# Patient Record
Sex: Female | Born: 1975 | Race: White | Hispanic: No | Smoking: Never smoker
Health system: Southern US, Community
[De-identification: ages and names within clinical notes are randomized; demographics above are authoritative.]

## PROBLEM LIST (undated history)

## (undated) DIAGNOSIS — G610 Guillain-Barre syndrome: Secondary | ICD-10-CM

## (undated) HISTORY — PX: APPENDECTOMY: SHX54

## (undated) HISTORY — PX: TONSILLECTOMY: SUR1361

---

## 2016-07-08 ENCOUNTER — Encounter (HOSPITAL_COMMUNITY): Payer: Self-pay | Admitting: *Deleted

## 2016-07-08 ENCOUNTER — Emergency Department (HOSPITAL_COMMUNITY): Payer: Self-pay

## 2016-07-08 ENCOUNTER — Emergency Department (HOSPITAL_COMMUNITY)
Admission: EM | Admit: 2016-07-08 | Discharge: 2016-07-08 | Disposition: A | Discharge status: Home / Self Care | Payer: Self-pay | Attending: Emergency Medicine | Admitting: Emergency Medicine

## 2016-07-08 DIAGNOSIS — N200 Calculus of kidney: Secondary | ICD-10-CM | POA: Insufficient documentation

## 2016-07-08 DIAGNOSIS — F17200 Nicotine dependence, unspecified, uncomplicated: Secondary | ICD-10-CM | POA: Insufficient documentation

## 2016-07-08 DIAGNOSIS — R109 Unspecified abdominal pain: Secondary | ICD-10-CM

## 2016-07-08 LAB — URINALYSIS, ROUTINE W REFLEX MICROSCOPIC
BILIRUBIN URINE: NEGATIVE
GLUCOSE, UA: NEGATIVE mg/dL
KETONES UR: NEGATIVE mg/dL
Nitrite: NEGATIVE
Protein, ur: NEGATIVE mg/dL
SPECIFIC GRAVITY, URINE: 1.004 — AB (ref 1.005–1.030)
pH: 6 (ref 5.0–8.0)

## 2016-07-08 LAB — POC URINE PREG, ED: Preg Test, Ur: NEGATIVE

## 2016-07-08 MED ORDER — IBUPROFEN 400 MG PO TABS: 400.0000 mg | ORAL_TABLET | Freq: Three times a day (TID) | ORAL | 0 refills | Status: AC

## 2016-07-08 MED ORDER — KETOROLAC TROMETHAMINE 30 MG/ML IJ SOLN
15.0000 mg | Freq: Once | INTRAMUSCULAR | Status: AC
  Administered 2016-07-08: 15 mg via INTRAMUSCULAR
  Filled 2016-07-08: qty 1

## 2016-07-08 NOTE — ED Triage Notes (Signed)
Pt is here for left side pain (under the axilla) which has been intermittent for about a week.  No pain or burning with urination.  Pt states that at times she did have some flank pain.  No GU or GI symtoms with this, no fever

## 2016-07-08 NOTE — Discharge Instructions (Signed)
As discussed, tonight's evaluation has been largely reassuring, and there is suspicion for your pain coming from a kidney stone. It is important to take all medication as directed, monitored carefully, and do not hesitate to return here for concerning changes. Otherwise, please be sure to follow-up with your primary care physician and our urology colleagues.

## 2016-07-08 NOTE — ED Provider Notes (Signed)
[facility]-EMERGENCY DEPT Provider Note   CSN: [id number] Arrival date & time: 07/08/16  1737     History   Chief Complaint Chief Complaint  Patient presents with  . Flank Pain    left mid axillary line    HPI Morgan Lester is a 41 y.o. female.     HPI    Presents with flank pain. Pain began a few days ago, suddenly. Since onset pain has been consistent, occurring, and stopping without clear intervention or precipitant. Pain is focally in the left upper quadrant, left mid thoracic back. Patient is sore, crampy. No medication taken for pain relief There is no associated nausea, vomiting, diarrhea, fever, chills, dysuria. Patient does have history of appendectomy, but otherwise no abdominal surgery, no substantial medical problems, including no history of kidney disease.  History reviewed. No pertinent past medical history.  There are no active problems to display for this patient.      Past Surgical History:  Procedure Laterality Date  . APPENDECTOMY    . TONSILLECTOMY      OB History    No data available       Home Medications    Prior to Admission medications   Not on File    Family History No family history on file.  Social History Social History  Substance Use Topics  . Smoking status: Current Every Day Smoker  . Smokeless tobacco: Never Used  . Alcohol use No     Allergies   Influenza vaccines and Tape   Review of Systems Review of Systems  Constitutional:       Per HPI, otherwise negative  HENT:       Per HPI, otherwise negative  Respiratory:       Per HPI, otherwise negative  Cardiovascular:       Per HPI, otherwise negative  Gastrointestinal: Negative for vomiting.  Endocrine:       Negative aside from HPI  Genitourinary:       Neg aside from HPI   Musculoskeletal:       Per HPI, otherwise negative  Skin: Negative.   Neurological: Negative for syncope.     Physical Exam Updated Vital Signs BP 119/72 (BP Location: Left  Arm)   Pulse 85   Temp 98.4 F (36.9 C) (Oral)   Resp 16   Wt 233 lb (105.7 kg)   LMP 07/01/2016   SpO2 100%   Physical Exam  Constitutional: She is oriented to person, place, and time. She appears well-developed and well-nourished. No distress.  HENT:  Head: Normocephalic and atraumatic.  Eyes: Conjunctivae and EOM are normal.  Cardiovascular: Normal rate and regular rhythm.   Pulmonary/Chest: Effort normal and breath sounds normal. No stridor. No respiratory distress.  Abdominal: She exhibits no distension.  Tenderness throughout the left costovertebral angle area, left upper quadrant, no other abdominal pain  Musculoskeletal: She exhibits no edema.  Neurological: She is alert and oriented to person, place, and time. No cranial nerve deficit.  Skin: Skin is warm and dry.  Psychiatric: She has a normal mood and affect.  Nursing note and vitals reviewed.    ED Treatments / Results  Labs (all labs ordered are listed, but only abnormal results are displayed) Labs Reviewed  URINALYSIS, ROUTINE W REFLEX MICROSCOPIC - Abnormal; Notable for the following:       Result Value   Color, Urine STRAW (*)    Specific Gravity, Urine 1.004 (*)    Hgb urine dipstick SMALL (*)  Leukocytes, UA SMALL (*)    Bacteria, UA RARE (*)    Squamous Epithelial / LPF 0-5 (*)    All other components within normal limits  POC URINE PREG, ED    Radiology Ct Renal Stone Study  Result Date: 07/08/2016 CLINICAL DATA:  Left flank pain. EXAM: CT ABDOMEN AND PELVIS WITHOUT CONTRAST TECHNIQUE: Multidetector CT imaging of the abdomen and pelvis was performed following the standard protocol without IV contrast. COMPARISON:  None. FINDINGS: Lower chest: No significant pulmonary nodules or acute consolidative airspace disease. Hepatobiliary: Normal liver size. Subcentimeter hypodense posterior right liver dome lesion, too small to characterize, for which no further follow-up is required unless the patient has  risk factors for liver malignancy. Otherwise no liver lesions. Normal gallbladder with no radiopaque cholelithiasis. No biliary ductal dilatation. Pancreas: Normal, with no mass or duct dilation. Spleen: Normal size. No mass. Adrenals/Urinary Tract: Normal adrenals. No contour deforming renal mass. No hydronephrosis. Punctate nonobstructing 1-2 mm lower right renal stone. No additional right renal stones. No left renal stones. Normal caliber ureters, with no ureteral stones . Normal bladder. Stomach/Bowel: Grossly normal stomach. Normal caliber small bowel with no small bowel wall thickening. Appendectomy. Normal large bowel with no diverticulosis, large bowel wall thickening or pericolonic fat stranding. Vascular/Lymphatic: Normal caliber abdominal aorta. No pathologically enlarged lymph nodes in the abdomen or pelvis. Reproductive: Grossly normal uterus.  No adnexal mass. Other: No pneumoperitoneum, ascites or focal fluid collection. Musculoskeletal: No aggressive appearing focal osseous lesions. Mild lower thoracic spondylosis. IMPRESSION: 1. Punctate nonobstructing lower right renal stone. No additional renal stones. No ureteral or bladder stones. No hydronephrosis. 2. No acute abnormality. No evidence of bowel obstruction or acute bowel inflammation. Electronically Signed   By: Delbert PhenixJason A Poff M.D.   On: 07/08/2016 19:30    Procedures Procedures (including critical care time)  Medications Ordered in ED Medications  ketorolac (TORADOL) 30 MG/ML injection 15 mg (not administered)     Initial Impression / Assessment and Plan / ED Course  I have reviewed the triage vital signs and the nursing notes.  Pertinent labs & imaging results that were available during my care of the patient were reviewed by me and considered in my medical decision making (see chart for details).  9:14 PM He appears more comfortable. We discussed all findings at length including evidence for kidney stone on the right,  suspicion for passed kidney stone on the left. Findings otherwise reassuring, with no evidence for urinary tract infection, reassuring CT scan, no hemodynamic instability. With these reassuring findings, and a soft, non-peritoneal abdomen on exam, the patient is appropriate for discharge with close outpatient follow-up.   Final Clinical Impressions(s) / ED Diagnoses   Final diagnoses:  Left flank pain  Kidney stone    New Prescriptions New Prescriptions   IBUPROFEN (ADVIL,MOTRIN) 400 MG TABLET    Take 1 tablet (400 mg total) by mouth 3 (three) times daily. Take one tablet three times daily for three days     Gerhard MunchLockwood, Kenitra Leventhal, MD 07/08/16 2115

## 2016-07-30 ENCOUNTER — Emergency Department (HOSPITAL_COMMUNITY): Payer: Self-pay

## 2016-07-30 ENCOUNTER — Emergency Department (HOSPITAL_COMMUNITY)
Admission: EM | Admit: 2016-07-30 | Discharge: 2016-07-30 | Disposition: A | Discharge status: Home / Self Care | Payer: Self-pay | Attending: Emergency Medicine | Admitting: Emergency Medicine

## 2016-07-30 ENCOUNTER — Encounter (HOSPITAL_COMMUNITY): Payer: Self-pay | Admitting: Emergency Medicine

## 2016-07-30 DIAGNOSIS — R42 Dizziness and giddiness: Secondary | ICD-10-CM | POA: Insufficient documentation

## 2016-07-30 DIAGNOSIS — F17200 Nicotine dependence, unspecified, uncomplicated: Secondary | ICD-10-CM | POA: Insufficient documentation

## 2016-07-30 DIAGNOSIS — Z79899 Other long term (current) drug therapy: Secondary | ICD-10-CM | POA: Insufficient documentation

## 2016-07-30 LAB — COMPREHENSIVE METABOLIC PANEL
ALK PHOS: 68 U/L (ref 38–126)
ALT: 16 U/L (ref 14–54)
AST: 16 U/L (ref 15–41)
Albumin: 3.9 g/dL (ref 3.5–5.0)
Anion gap: 7 (ref 5–15)
BUN: 17 mg/dL (ref 6–20)
CALCIUM: 9 mg/dL (ref 8.9–10.3)
CO2: 27 mmol/L (ref 22–32)
CREATININE: 0.62 mg/dL (ref 0.44–1.00)
Chloride: 104 mmol/L (ref 101–111)
Glucose, Bld: 94 mg/dL (ref 65–99)
Potassium: 3.9 mmol/L (ref 3.5–5.1)
Sodium: 138 mmol/L (ref 135–145)
Total Bilirubin: 0.9 mg/dL (ref 0.3–1.2)
Total Protein: 7.5 g/dL (ref 6.5–8.1)

## 2016-07-30 LAB — URINALYSIS, ROUTINE W REFLEX MICROSCOPIC
Bacteria, UA: NONE SEEN
Bilirubin Urine: NEGATIVE
GLUCOSE, UA: NEGATIVE mg/dL
KETONES UR: NEGATIVE mg/dL
Nitrite: NEGATIVE
PH: 5 (ref 5.0–8.0)
Protein, ur: NEGATIVE mg/dL
SPECIFIC GRAVITY, URINE: 1.025 (ref 1.005–1.030)

## 2016-07-30 LAB — CBC
HCT: 38.5 % (ref 36.0–46.0)
Hemoglobin: 12.5 g/dL (ref 12.0–15.0)
MCH: 30.1 pg (ref 26.0–34.0)
MCHC: 32.5 g/dL (ref 30.0–36.0)
MCV: 92.8 fL (ref 78.0–100.0)
PLATELETS: 342 10*3/uL (ref 150–400)
RBC: 4.15 MIL/uL (ref 3.87–5.11)
RDW: 12.3 % (ref 11.5–15.5)
WBC: 8.2 10*3/uL (ref 4.0–10.5)

## 2016-07-30 MED ORDER — SODIUM CHLORIDE 0.9 % IV BOLUS (SEPSIS)
1000.0000 mL | Freq: Once | INTRAVENOUS | Status: AC
  Administered 2016-07-30: 1000 mL via INTRAVENOUS

## 2016-07-30 MED ORDER — ONDANSETRON HCL 4 MG/2ML IJ SOLN
4.0000 mg | Freq: Once | INTRAMUSCULAR | Status: AC
  Administered 2016-07-30: 4 mg via INTRAVENOUS
  Filled 2016-07-30: qty 2

## 2016-07-30 MED ORDER — MECLIZINE HCL 12.5 MG PO TABS
25.0000 mg | ORAL_TABLET | Freq: Once | ORAL | Status: AC
  Administered 2016-07-30: 25 mg via ORAL
  Filled 2016-07-30: qty 2

## 2016-07-30 MED ORDER — MECLIZINE HCL 25 MG PO TABS: 25.0000 mg | ORAL_TABLET | Freq: Three times a day (TID) | ORAL | 0 refills | Status: DC | PRN

## 2016-07-30 MED ORDER — ONDANSETRON 4 MG PO TBDP: ORAL_TABLET | ORAL | 0 refills | Status: DC

## 2016-07-30 NOTE — ED Provider Notes (Signed)
[facility] Provider Note   CSN: [id number] Arrival date & time: 07/30/16  0856     History   Chief Complaint Chief Complaint  Patient presents with  . Flank Pain    HPI Morgan Lester is a 41 y.o. female.  Patient states that she had some dizziness with nausea today. She states room feeling of spinning around   The history is provided by the patient. No language interpreter was used.  Dizziness  Quality:  Head spinning Severity:  Moderate Onset quality:  Sudden Timing:  Constant Progression:  Unchanged Chronicity:  New Context: bending over   Associated symptoms: no chest pain, no diarrhea and no headaches     History reviewed. No pertinent past medical history.  There are no active problems to display for this patient.   Past Surgical History:  Procedure Laterality Date  . APPENDECTOMY    . TONSILLECTOMY      OB History    No data available       Home Medications    Prior to Admission medications   Medication Sig Start Date End Date Taking? Authorizing Provider  acetaminophen (TYLENOL) 500 MG tablet Take 500 mg by mouth every 6 (six) hours as needed for mild pain or moderate pain.   Yes [provider]  ibuprofen (ADVIL,MOTRIN) 200 MG tablet Take 200 mg by mouth every 6 (six) hours as needed.   Yes [provider]  meclizine (ANTIVERT) 25 MG tablet Take 1 tablet (25 mg total) by mouth 3 (three) times daily as needed for dizziness. 07/30/16   Bethann Berkshire, MD  ondansetron (ZOFRAN ODT) 4 MG disintegrating tablet 4mg  ODT q4 hours prn nausea/vomit 07/30/16   Bethann Berkshire, MD    Family History History reviewed. No pertinent family history.  Social History Social History  Substance Use Topics  . Smoking status: Current Every Day Smoker  . Smokeless tobacco: Never Used  . Alcohol use No     Allergies   Influenza vaccines and Tape   Review of Systems Review of Systems  Constitutional: Negative for appetite change and  fatigue.  HENT: Negative for congestion, ear discharge and sinus pressure.   Eyes: Negative for discharge.  Respiratory: Negative for cough.   Cardiovascular: Negative for chest pain.  Gastrointestinal: Negative for abdominal pain and diarrhea.  Genitourinary: Negative for frequency and hematuria.  Musculoskeletal: Negative for back pain.  Skin: Negative for rash.  Neurological: Positive for dizziness. Negative for seizures and headaches.  Psychiatric/Behavioral: Negative for hallucinations.     Physical Exam Updated Vital Signs BP 109/78   Pulse (!) 56   Temp 97.6 F (36.4 C) (Oral)   Resp 18   Ht 5\' 7"  (1.702 m)   Wt 104.3 kg (230 lb)   LMP 07/30/2016   SpO2 96%   BMI 36.02 kg/m   Physical Exam  Constitutional: She is oriented to person, place, and time. She appears well-developed.  HENT:  Head: Normocephalic.  No nystagmus  Eyes: Conjunctivae and EOM are normal. No scleral icterus.  Neck: Neck supple. No thyromegaly present.  Cardiovascular: Normal rate and regular rhythm.  Exam reveals no gallop and no friction rub.   No murmur heard. Pulmonary/Chest: No stridor. She has no wheezes. She has no rales. She exhibits no tenderness.  Abdominal: She exhibits no distension. There is no tenderness. There is no rebound.  Musculoskeletal: Normal range of motion. She exhibits no edema.  Lymphadenopathy:    She has no cervical adenopathy.  Neurological: She is  oriented to person, place, and time. She exhibits normal muscle tone. Coordination normal.  Skin: No rash noted. No erythema.  Psychiatric: She has a normal mood and affect. Her behavior is normal.     ED Treatments / Results  Labs (all labs ordered are listed, but only abnormal results are displayed) Labs Reviewed  URINALYSIS, ROUTINE W REFLEX MICROSCOPIC - Abnormal; Notable for the following:       Result Value   APPearance HAZY (*)    Hgb urine dipstick LARGE (*)    Leukocytes, UA TRACE (*)    Squamous  Epithelial / LPF 6-30 (*)    All other components within normal limits  URINE CULTURE  COMPREHENSIVE METABOLIC PANEL  CBC    EKG  EKG Interpretation None       Radiology Ct Head Wo Contrast  Result Date: 07/30/2016 CLINICAL DATA:  Dizzy with extremity tremor since last night. EXAM: CT HEAD WITHOUT CONTRAST TECHNIQUE: Contiguous axial images were obtained from the base of the skull through the vertex without intravenous contrast. COMPARISON:  None. FINDINGS: Brain: No evidence of acute infarction, hemorrhage, hydrocephalus, extra-axial collection or mass effect. Question 6 mm pineal cyst, which would be incidental. Vascular: Negative Skull: Negative Sinuses/Orbits: Negative IMPRESSION: No acute finding or explanation for symptoms. Electronically Signed   By: Marnee SpringJonathon  Watts M.D.   On: 07/30/2016 11:47    Procedures Procedures (including critical care time)  Medications Ordered in ED Medications  sodium chloride 0.9 % bolus 1,000 mL (0 mLs Intravenous Stopped 07/30/16 1100)  ondansetron (ZOFRAN) injection 4 mg (4 mg Intravenous Given 07/30/16 0947)  meclizine (ANTIVERT) tablet 25 mg (25 mg Oral Given 07/30/16 0947)     Initial Impression / Assessment and Plan / ED Course  I have reviewed the triage vital signs and the nursing notes.  Pertinent labs & imaging results that were available during my care of the patient were reviewed by me and considered in my medical decision making (see chart for details).     Patient with vertigo symptoms. She improved with Antivert and Zofran. Labs unremarkable except for blood in her urine. Patient is on her period now. She will follow-up with family practice doctor and she is given Antivert and Zofran  Final Clinical Impressions(s) / ED Diagnoses   Final diagnoses:  Vertigo    New Prescriptions New Prescriptions   MECLIZINE (ANTIVERT) 25 MG TABLET    Take 1 tablet (25 mg total) by mouth 3 (three) times daily as needed for dizziness.    ONDANSETRON (ZOFRAN ODT) 4 MG DISINTEGRATING TABLET    4mg  ODT q4 hours prn nausea/vomit     Bethann BerkshireZammit, Tarryn Bogdan, MD 07/30/16 1303

## 2016-07-30 NOTE — ED Triage Notes (Addendum)
Pt reports bilateral flank pain radiating to lower back and abd with dysuria. Pt reports seen for same x1 week ago and reports was diagnosed with kidney stones. Pt also reports dizziness started last night. Pt reports history of vertigo and ear infections. nad noted. Pt alert and oriented. Generalized fatigue noted.

## 2016-07-30 NOTE — Discharge Instructions (Signed)
Follow-up with Dr. Delton SeeNelson for recheck.

## 2016-08-01 LAB — URINE CULTURE

## 2017-03-06 ENCOUNTER — Encounter (HOSPITAL_COMMUNITY): Payer: Self-pay | Admitting: Emergency Medicine

## 2017-03-06 ENCOUNTER — Emergency Department (HOSPITAL_COMMUNITY): Payer: Self-pay

## 2017-03-06 ENCOUNTER — Emergency Department (HOSPITAL_COMMUNITY)
Admission: EM | Admit: 2017-03-06 | Discharge: 2017-03-06 | Disposition: A | Discharge status: Home / Self Care | Payer: Self-pay | Attending: Emergency Medicine | Admitting: Emergency Medicine

## 2017-03-06 ENCOUNTER — Other Ambulatory Visit: Payer: Self-pay

## 2017-03-06 DIAGNOSIS — J209 Acute bronchitis, unspecified: Secondary | ICD-10-CM | POA: Insufficient documentation

## 2017-03-06 DIAGNOSIS — R05 Cough: Secondary | ICD-10-CM | POA: Insufficient documentation

## 2017-03-06 DIAGNOSIS — Z79899 Other long term (current) drug therapy: Secondary | ICD-10-CM | POA: Insufficient documentation

## 2017-03-06 DIAGNOSIS — G610 Guillain-Barre syndrome: Secondary | ICD-10-CM | POA: Insufficient documentation

## 2017-03-06 DIAGNOSIS — M549 Dorsalgia, unspecified: Secondary | ICD-10-CM | POA: Insufficient documentation

## 2017-03-06 DIAGNOSIS — R0789 Other chest pain: Secondary | ICD-10-CM | POA: Insufficient documentation

## 2017-03-06 HISTORY — DX: Guillain-Barre syndrome: G61.0

## 2017-03-06 LAB — POC URINE PREG, ED: Preg Test, Ur: NEGATIVE

## 2017-03-06 LAB — CBC WITH DIFFERENTIAL/PLATELET
Basophils Absolute: 0 10*3/uL (ref 0.0–0.1)
Basophils Relative: 0 %
Eosinophils Absolute: 0.1 10*3/uL (ref 0.0–0.7)
Eosinophils Relative: 1 %
HEMATOCRIT: 38.7 % (ref 36.0–46.0)
HEMOGLOBIN: 12.4 g/dL (ref 12.0–15.0)
LYMPHS ABS: 3.5 10*3/uL (ref 0.7–4.0)
LYMPHS PCT: 37 %
MCH: 29 pg (ref 26.0–34.0)
MCHC: 32 g/dL (ref 30.0–36.0)
MCV: 90.6 fL (ref 78.0–100.0)
MONOS PCT: 4 %
Monocytes Absolute: 0.4 10*3/uL (ref 0.1–1.0)
NEUTROS ABS: 5.5 10*3/uL (ref 1.7–7.7)
NEUTROS PCT: 58 %
Platelets: 396 10*3/uL (ref 150–400)
RBC: 4.27 MIL/uL (ref 3.87–5.11)
RDW: 12.4 % (ref 11.5–15.5)
WBC: 9.5 10*3/uL (ref 4.0–10.5)

## 2017-03-06 LAB — BASIC METABOLIC PANEL
ANION GAP: 8 (ref 5–15)
BUN: 14 mg/dL (ref 6–20)
CHLORIDE: 104 mmol/L (ref 101–111)
CO2: 28 mmol/L (ref 22–32)
Calcium: 9.2 mg/dL (ref 8.9–10.3)
Creatinine, Ser: 0.8 mg/dL (ref 0.44–1.00)
GFR calc non Af Amer: >60 mL/min (ref >60)
Glucose, Bld: 90 mg/dL (ref 65–99)
POTASSIUM: 3.7 mmol/L (ref 3.5–5.1)
Sodium: 140 mmol/L (ref 135–145)

## 2017-03-06 LAB — D-DIMER, QUANTITATIVE: D-Dimer, Quant: 0.47 ug/mL-FEU (ref 0.00–0.50)

## 2017-03-06 LAB — TROPONIN I

## 2017-03-06 MED ORDER — HYDROCOD POLST-CPM POLST ER 10-8 MG/5ML PO SUER: 5.0000 mL | Freq: Two times a day (BID) | ORAL | 0 refills | Status: DC | PRN

## 2017-03-06 MED ORDER — PREDNISONE 50 MG PO TABS: ORAL_TABLET | ORAL | 0 refills | Status: DC

## 2017-03-06 MED ORDER — BENZONATATE 100 MG PO CAPS
200.0000 mg | ORAL_CAPSULE | Freq: Once | ORAL | Status: AC
  Administered 2017-03-06: 200 mg via ORAL
  Filled 2017-03-06: qty 2

## 2017-03-06 MED ORDER — ALBUTEROL SULFATE (2.5 MG/3ML) 0.083% IN NEBU
2.5000 mg | INHALATION_SOLUTION | Freq: Once | RESPIRATORY_TRACT | Status: AC
  Administered 2017-03-06: 2.5 mg via RESPIRATORY_TRACT
  Filled 2017-03-06: qty 3

## 2017-03-06 MED ORDER — HYDROCOD POLST-CPM POLST ER 10-8 MG/5ML PO SUER
5.0000 mL | Freq: Once | ORAL | Status: AC
  Administered 2017-03-06: 5 mL via ORAL
  Filled 2017-03-06: qty 5

## 2017-03-06 MED ORDER — BENZONATATE 100 MG PO CAPS: 200.0000 mg | ORAL_CAPSULE | Freq: Three times a day (TID) | ORAL | 0 refills | Status: DC | PRN

## 2017-03-06 MED ORDER — IPRATROPIUM-ALBUTEROL 0.5-2.5 (3) MG/3ML IN SOLN
3.0000 mL | Freq: Once | RESPIRATORY_TRACT | Status: AC
  Administered 2017-03-06: 3 mL via RESPIRATORY_TRACT
  Filled 2017-03-06: qty 3

## 2017-03-06 MED ORDER — PREDNISONE 50 MG PO TABS
60.0000 mg | ORAL_TABLET | Freq: Once | ORAL | Status: AC
  Administered 2017-03-06: 23:00:00 60 mg via ORAL
  Filled 2017-03-06: qty 1

## 2017-03-06 NOTE — Discharge Instructions (Signed)
Make sure you are drinking plenty of fluids.  Use the medicines prescribed.  If you choose to get the tussionex prescription filled, be aware that this is a narcotic medicine and you should not drive within 4 hours of taking it as it can make you sleepy. Also, cepacol cough lozenges may help as can a teaspoon of honey every few hours.

## 2017-03-06 NOTE — ED Provider Notes (Signed)
[facility] EMERGENCY DEPARTMENT Provider Note   CSN: [id number] Arrival date & time: 03/06/17  4098     History   Chief Complaint Chief Complaint  Patient presents with  . Shortness of Breath    HPI Morgan Lester is a 42 y.o. female presenting with a one-month history of upper respiratory type symptoms.  She states a month ago she had nasal congestion with clear rhinorrhea, mild sore throat and cough which has mostly cleared except for the persistent cough.  Her cough has been nonproductive but has been fairly persistent.  Over the past 2 days she has developed increased shortness of breath and now has pleuritic pain both in her anterior chest which radiates into her middle upper back and is worsened with deep inspiration and coughing.  Her cough has been nonproductive.  She denies fevers or chills.  She denies wheezing, orthopnea, hemoptysis.  She endorses a 30-hour car ride over the Christmas holiday driving to [place] and back, and has noted bilateral lower extremity soreness and several episodes of bilateral calf muscle spasm but denies any edema or ongoing pain in her legs.  She has taken several OTC cough remedies without significant improvement in her symptoms.  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   The history is provided by the patient and the spouse.    Past Medical History:  Diagnosis Date  . Guillain Barr syndrome (HCC)     There are no active problems to display for this patient.   Past Surgical History:  Procedure Laterality Date  . APPENDECTOMY    . TONSILLECTOMY      OB History    No data available       Home Medications    Prior to Admission medications   Medication Sig Start Date End Date Taking? Authorizing Provider  acetaminophen (TYLENOL) 500 MG tablet Take 500 mg by mouth every 6 (six) hours as needed for mild pain or moderate pain.    [provider]  benzonatate (TESSALON) 100 MG capsule Take 2 capsules (200 mg  total) by mouth 3 (three) times daily as needed. 03/06/17   Burgess Amor, PA-C  chlorpheniramine-HYDROcodone (TUSSIONEX PENNKINETIC ER) 10-8 MG/5ML SUER Take 5 mLs by mouth every 12 (twelve) hours as needed for cough. 03/06/17   Burgess Amor, PA-C  ibuprofen (ADVIL,MOTRIN) 200 MG tablet Take 200 mg by mouth every 6 (six) hours as needed.    [provider]  meclizine (ANTIVERT) 25 MG tablet Take 1 tablet (25 mg total) by mouth 3 (three) times daily as needed for dizziness. 07/30/16   Bethann Berkshire, MD  ondansetron (ZOFRAN ODT) 4 MG disintegrating tablet 4mg  ODT q4 hours prn nausea/vomit 07/30/16   Bethann Berkshire, MD  predniSONE (DELTASONE) 50 MG tablet Take your next dose of prednisone tomorrow evening. 03/07/17   Burgess Amor, PA-C    Family History History reviewed. No pertinent family history.  Social History Social History   Tobacco Use  . Smoking status: Never Smoker  . Smokeless tobacco: Never Used  Substance Use Topics  . Alcohol use: No  . Drug use: No     Allergies   Influenza vaccines and Tape   Review of Systems Review of Systems  Constitutional: Negative for fever.  HENT: Negative for congestion and sore throat.   Eyes: Negative.   Respiratory: Positive for cough, chest tightness, shortness of breath and wheezing.   Cardiovascular: Positive for chest pain. Negative for palpitations and leg swelling.  Gastrointestinal: Negative for abdominal pain and nausea.  Genitourinary: Negative.   Musculoskeletal: Positive for back pain. Negative for arthralgias, joint swelling and neck pain.  Skin: Negative.  Negative for rash and wound.  Neurological: Negative for dizziness, weakness, light-headedness, numbness and headaches.  Psychiatric/Behavioral: Negative.      Physical Exam Updated Vital Signs BP 127/80 (BP Location: Right Arm)   Pulse 82   Temp 98.7 F (37.1 C) (Oral)   Resp 17   LMP 02/24/2017   SpO2 98%   Physical Exam  Constitutional: She appears  well-developed and well-nourished.  HENT:  Head: Normocephalic and atraumatic.  Eyes: Conjunctivae are normal.  Neck: Normal range of motion.  Cardiovascular: Normal rate, regular rhythm, normal heart sounds and intact distal pulses.  Pulmonary/Chest: Effort normal and breath sounds normal. No stridor. She has no decreased breath sounds. She has no wheezes. She has no rhonchi. She has no rales. She exhibits no swelling.  No respiratory distress.  The frequent dry cough during exam.  Abdominal: Soft. Bowel sounds are normal. There is no tenderness.  Musculoskeletal: Normal range of motion.       Right lower leg: She exhibits no tenderness and no edema.       Left lower leg: She exhibits no tenderness and no edema.  Neurological: She is alert.  Skin: Skin is warm and dry.  Psychiatric: She has a normal mood and affect.  Nursing note and vitals reviewed.    ED Treatments / Results  Labs (all labs ordered are listed, but only abnormal results are displayed) Labs Reviewed  BASIC METABOLIC PANEL  CBC WITH DIFFERENTIAL/PLATELET  D-DIMER, QUANTITATIVE (NOT AT [facility])  TROPONIN I  POC URINE PREG, ED    EKG  EKG Interpretation  Date/Time:  Friday March 06 2017 21:11:10 EST Ventricular Rate:  79 PR Interval:    QRS Duration: 94 QT Interval:  375 QTC Calculation: 430 R Axis:   10 Text Interpretation:  Sinus rhythm Low voltage, precordial leads Borderline repolarization abnormality Baseline wander No old tracing to compare Confirmed by Samuel JesterMcManus, Kathleen [phone]([id number]) on 03/06/2017 9:59:23 PM       Radiology Dg Chest 2 View  Result Date: 03/06/2017 CLINICAL DATA:  Shortness of breath and midsternal chest pain for several days. EXAM: CHEST  2 VIEW COMPARISON:  None. FINDINGS: The heart size and mediastinal contours are within normal limits. Both lungs are clear. The visualized skeletal structures are unremarkable. IMPRESSION: No active cardiopulmonary disease. Electronically Signed   By:  Sherian ReinWei-Chen  Lin M.D.   On: 03/06/2017 20:56    Procedures Procedures (including critical care time)  Medications Ordered in ED Medications  chlorpheniramine-HYDROcodone (TUSSIONEX) 10-8 MG/5ML suspension 5 mL (5 mLs Oral Given 03/06/17 2107)  ipratropium-albuterol (DUONEB) 0.5-2.5 (3) MG/3ML nebulizer solution 3 mL (3 mLs Nebulization Given 03/06/17 2131)  albuterol (PROVENTIL) (2.5 MG/3ML) 0.083% nebulizer solution 2.5 mg (2.5 mg Nebulization Given 03/06/17 2132)  benzonatate (TESSALON) capsule 200 mg (200 mg Oral Given 03/06/17 2253)  predniSONE (DELTASONE) tablet 60 mg (60 mg Oral Given 03/06/17 2253)     Initial Impression / Assessment and Plan / ED Course  I have reviewed the triage vital signs and the nursing notes.  Pertinent labs & imaging results that were available during my care of the patient were reviewed by me and considered in my medical decision making (see chart for details).     Patient with a one-month history of persistent cough which started with classic URI symptoms but now with nonproductive  cough and pleuritic chest and back pain, imaging negative, labs negative including a negative d-dimer test, without other significant risk factors so doubt PE.  EKG is normal.  Patient does not have fever or pharyngeal findings to suggest pertussis.  She was given an L albuterol nebulizer treatment which seemed to worsen her cough.  She had no wheezing on exam nor by history so this medication was not continued.  She did get some improvement with Tussionex so this was prescribed to her.  Also started her on Tessalon and pulse dosing with prednisone for anti-inflammatory and if it.  She was encouraged increase fluid intake, OTC cough drops, discussed other home remedies including humidified air, honey.  PRN follow-up anticipated.  Final Clinical Impressions(s) / ED Diagnoses   Final diagnoses:  Acute bronchitis, unspecified organism    ED Discharge Orders        Ordered     predniSONE (DELTASONE) 50 MG tablet     03/06/17 2242    benzonatate (TESSALON) 100 MG capsule  3 times daily PRN     03/06/17 2242    chlorpheniramine-HYDROcodone (TUSSIONEX PENNKINETIC ER) 10-8 MG/5ML SUER  Every 12 hours PRN     03/06/17 2242       Burgess Amor, PA-C 03/07/17 0328    Samuel Jester, DO 03/08/17 1535

## 2017-03-06 NOTE — ED Triage Notes (Signed)
Fighting a cold on and off   Non productive cough   earaches

## 2017-05-01 ENCOUNTER — Observation Stay (HOSPITAL_COMMUNITY)
Admission: EM | Admit: 2017-05-01 | Discharge: 2017-05-02 | Disposition: A | Discharge status: Home / Self Care | Payer: Self-pay | Attending: Internal Medicine | Admitting: Internal Medicine

## 2017-05-01 ENCOUNTER — Observation Stay (HOSPITAL_COMMUNITY): Payer: Self-pay

## 2017-05-01 ENCOUNTER — Encounter (HOSPITAL_COMMUNITY): Payer: Self-pay | Admitting: Cardiology

## 2017-05-01 ENCOUNTER — Emergency Department (HOSPITAL_COMMUNITY): Payer: Self-pay

## 2017-05-01 ENCOUNTER — Other Ambulatory Visit: Payer: Self-pay

## 2017-05-01 ENCOUNTER — Observation Stay (HOSPITAL_BASED_OUTPATIENT_CLINIC_OR_DEPARTMENT_OTHER): Payer: Self-pay

## 2017-05-01 DIAGNOSIS — R531 Weakness: Secondary | ICD-10-CM

## 2017-05-01 DIAGNOSIS — G459 Transient cerebral ischemic attack, unspecified: Principal | ICD-10-CM | POA: Insufficient documentation

## 2017-05-01 LAB — CBC WITH DIFFERENTIAL/PLATELET
Basophils Absolute: 0 10*3/uL (ref 0.0–0.1)
Basophils Relative: 0 %
Eosinophils Absolute: 0.1 10*3/uL (ref 0.0–0.7)
Eosinophils Relative: 2 %
HCT: 39.2 % (ref 36.0–46.0)
Hemoglobin: 12.5 g/dL (ref 12.0–15.0)
Lymphocytes Relative: 31 %
Lymphs Abs: 1.7 10*3/uL (ref 0.7–4.0)
MCH: 28.9 pg (ref 26.0–34.0)
MCHC: 31.9 g/dL (ref 30.0–36.0)
MCV: 90.5 fL (ref 78.0–100.0)
Monocytes Absolute: 0.4 10*3/uL (ref 0.1–1.0)
Monocytes Relative: 7 %
Neutro Abs: 3.3 10*3/uL (ref 1.7–7.7)
Neutrophils Relative %: 60 %
Platelets: 363 10*3/uL (ref 150–400)
RBC: 4.33 MIL/uL (ref 3.87–5.11)
RDW: 12.9 % (ref 11.5–15.5)
WBC: 5.4 10*3/uL (ref 4.0–10.5)

## 2017-05-01 LAB — BASIC METABOLIC PANEL
Anion gap: 11 (ref 5–15)
BUN: 14 mg/dL (ref 6–20)
CO2: 24 mmol/L (ref 22–32)
Calcium: 9.2 mg/dL (ref 8.9–10.3)
Chloride: 105 mmol/L (ref 101–111)
Creatinine, Ser: 0.71 mg/dL (ref 0.44–1.00)
GFR calc Af Amer: >60 mL/min (ref >60)
GFR calc non Af Amer: >60 mL/min (ref >60)
Glucose, Bld: 90 mg/dL (ref 65–99)
Potassium: 3.8 mmol/L (ref 3.5–5.1)
Sodium: 140 mmol/L (ref 135–145)

## 2017-05-01 LAB — ECHOCARDIOGRAM COMPLETE
Height: 67 in
WEIGHTICAEL: 3520 [oz_av]

## 2017-05-01 MED ORDER — ENOXAPARIN SODIUM 40 MG/0.4ML ~~LOC~~ SOLN
40.0000 mg | SUBCUTANEOUS | Status: DC
  Administered 2017-05-01: 40 mg via SUBCUTANEOUS
  Filled 2017-05-01: qty 0.4

## 2017-05-01 MED ORDER — ASPIRIN 300 MG RE SUPP
300.0000 mg | Freq: Every day | RECTAL | Status: DC
  Administered 2017-05-01: 300 mg via RECTAL
  Filled 2017-05-01 (×2): qty 1

## 2017-05-01 MED ORDER — SODIUM CHLORIDE 0.9 % IV SOLN
INTRAVENOUS | Status: DC
  Administered 2017-05-01 – 2017-05-02 (×2): via INTRAVENOUS

## 2017-05-01 MED ORDER — ACETAMINOPHEN 160 MG/5ML PO SOLN: 650.0000 mg | ORAL | Status: DC | PRN

## 2017-05-01 MED ORDER — ASPIRIN 325 MG PO TABS
325.0000 mg | ORAL_TABLET | Freq: Every day | ORAL | Status: DC
  Administered 2017-05-02: 325 mg via ORAL
  Filled 2017-05-01: qty 1

## 2017-05-01 MED ORDER — SENNOSIDES-DOCUSATE SODIUM 8.6-50 MG PO TABS: 1.0000 | ORAL_TABLET | Freq: Every evening | ORAL | Status: DC | PRN

## 2017-05-01 MED ORDER — STROKE: EARLY STAGES OF RECOVERY BOOK
Freq: Once | Status: AC
  Administered 2017-05-01: 16:00:00

## 2017-05-01 MED ORDER — ACETAMINOPHEN 650 MG RE SUPP: 650.0000 mg | RECTAL | Status: DC | PRN

## 2017-05-01 MED ORDER — ACETAMINOPHEN 325 MG PO TABS
650.0000 mg | ORAL_TABLET | ORAL | Status: DC | PRN
  Administered 2017-05-01 – 2017-05-02 (×2): 650 mg via ORAL
  Filled 2017-05-01 (×2): qty 2

## 2017-05-01 NOTE — H&P (Signed)
History and Physical    Morgan Lester VWU:[id number] DOB: May 05, 1975 DOA: 05/01/2017  Referring MD/NP/PA: Raeford Razor, EDP PCP: Patient, No Pcp Per  Patient coming from: Home  Chief Complaint: Left-sided weakness  HPI: Morgan Lester is a 42 y.o. female without significant past medical history who comes to the hospital today with left-sided weakness and dysarthria that began this morning.  She states she went to bed feeling normal and woke up this morning with the symptoms.  She has already been seen by neurology, she is not a TPA candidate due to unclear onset of symptoms.  She states she had a significant headache at onset of symptoms as well.  Neurology believes stroke needs to be ruled out, however with atypical speech symptoms conversion disorder needs to be in the differential diagnosis.  Admission is requested.  Past Medical/Surgical History: Past Medical History:  Diagnosis Date  . Guillain Barr syndrome [facility])     Past Surgical History:  Procedure Laterality Date  . APPENDECTOMY    . TONSILLECTOMY      Social History:  reports that  has never smoked. she has never used smokeless tobacco. She reports that she does not drink alcohol or use drugs.  Allergies: Allergies  Allergen Reactions  . Influenza Vaccines Other (See Comments)    GBS  . Tape Rash    Silk tape    Family History:  States her brother had a stroke at young age as well  Prior to Admission medications   Medication Sig Start Date End Date Taking? Authorizing Provider  acetaminophen (TYLENOL) 500 MG tablet Take 500 mg by mouth every 6 (six) hours as needed for mild pain or moderate pain.    [provider]  benzonatate (TESSALON) 100 MG capsule Take 2 capsules (200 mg total) by mouth 3 (three) times daily as needed. 03/06/17   Burgess Amor, PA-C  chlorpheniramine-HYDROcodone (TUSSIONEX PENNKINETIC ER) 10-8 MG/5ML SUER Take 5 mLs by mouth every 12 (twelve) hours as needed for cough. 03/06/17   Burgess Amor, PA-C  ibuprofen (ADVIL,MOTRIN) 200 MG tablet Take 200 mg by mouth every 6 (six) hours as needed.    [provider]  meclizine (ANTIVERT) 25 MG tablet Take 1 tablet (25 mg total) by mouth 3 (three) times daily as needed for dizziness. 07/30/16   Bethann Berkshire, MD  ondansetron (ZOFRAN ODT) 4 MG disintegrating tablet 4mg  ODT q4 hours prn nausea/vomit 07/30/16   Bethann Berkshire, MD  predniSONE (DELTASONE) 50 MG tablet Take your next dose of prednisone tomorrow evening. 03/07/17   Burgess Amor, PA-C    Review of Systems:  Constitutional: Denies fever, chills, diaphoresis, appetite change and fatigue.  HEENT: Denies photophobia, eye pain, redness, hearing loss, ear pain, congestion, sore throat, rhinorrhea, sneezing, mouth sores, trouble swallowing, neck pain, neck stiffness and tinnitus.   Respiratory: Denies SOB, DOE, cough, chest tightness,  and wheezing.   Cardiovascular: Denies chest pain, palpitations and leg swelling.  Gastrointestinal: Denies nausea, vomiting, abdominal pain, diarrhea, constipation, blood in stool and abdominal distention.  Genitourinary: Denies dysuria, urgency, frequency, hematuria, flank pain and difficulty urinating.  Endocrine: Denies: hot or cold intolerance, sweats, changes in hair or nails, polyuria, polydipsia. Musculoskeletal: Denies myalgias, back pain, joint swelling, arthralgias and gait problem.  Skin: Denies pallor, rash and wound.  Neurological: Denies dizziness, seizures, syncope, weakness, light-headedness, numbness and headaches.  Hematological: Denies adenopathy. Easy bruising, personal or family bleeding history  Psychiatric/Behavioral: Denies suicidal ideation, mood changes, confusion, nervousness, sleep disturbance and agitation  Physical Exam: Vitals:   05/01/17 1130 05/01/17 1137 05/01/17 1200 05/01/17 1230  BP: 125/79  131/81 119/78  Pulse: 70  81 74  Resp: 20  18 15   Temp:  98.2 F (36.8 C)    TempSrc:      SpO2: 98%  97% 99%    Weight:      Height:         Constitutional: NAD, calm, comfortable Eyes: PERRL, lids and conjunctivae normal ENMT: Mucous membranes are moist. Posterior pharynx clear of any exudate or lesions.Normal dentition.  Neck: normal, supple, no masses, no thyromegaly Respiratory: clear to auscultation bilaterally, no wheezing, no crackles. Normal respiratory effort. No accessory muscle use.  Cardiovascular: Regular rate and rhythm, no murmurs / rubs / gallops. No extremity edema. 2+ pedal pulses. No carotid bruits.  Abdomen: no tenderness, no masses palpated. No hepatosplenomegaly. Bowel sounds positive.  Musculoskeletal: no clubbing / cyanosis. No joint deformity upper and lower extremities. Good ROM, no contractures. Normal muscle tone.  Skin: no rashes, lesions, ulcers. No induration Neurologic: CN 2-12 grossly intact.  Objective motor weakness specifically of the left leg but also arm, states sensation is dulled to touch over the left upper and lower extremity as well, stuttering speech, Psychiatric: Normal judgment and insight. Alert and oriented x 3. Normal mood.    Labs on Admission: I have personally reviewed the following labs and imaging studies  CBC: Recent Labs  Lab 05/01/17 1037  WBC 5.4  NEUTROABS 3.3  HGB 12.5  HCT 39.2  MCV 90.5  PLT 363   Basic Metabolic Panel: Recent Labs  Lab 05/01/17 1037  NA 140  K 3.8  CL 105  CO2 24  GLUCOSE 90  BUN 14  CREATININE 0.71  CALCIUM 9.2   GFR: Estimated Creatinine Clearance: 112.3 mL/min (by C-G formula based on SCr of 0.71 mg/dL). Liver Function Tests: No results for input(s): AST, ALT, ALKPHOS, BILITOT, PROT, ALBUMIN in the last 168 hours. No results for input(s): LIPASE, AMYLASE in the last 168 hours. No results for input(s): AMMONIA in the last 168 hours. Coagulation Profile: No results for input(s): INR, PROTIME in the last 168 hours. Cardiac Enzymes: No results for input(s): CKTOTAL, CKMB, CKMBINDEX, TROPONINI  in the last 168 hours. BNP (last 3 results) No results for input(s): PROBNP in the last 8760 hours. HbA1C: No results for input(s): HGBA1C in the last 72 hours. CBG: No results for input(s): GLUCAP in the last 168 hours. Lipid Profile: No results for input(s): CHOL, HDL, LDLCALC, TRIG, CHOLHDL, LDLDIRECT in the last 72 hours. Thyroid Function Tests: No results for input(s): TSH, T4TOTAL, FREET4, T3FREE, THYROIDAB in the last 72 hours. Anemia Panel: No results for input(s): VITAMINB12, FOLATE, FERRITIN, TIBC, IRON, RETICCTPCT in the last 72 hours. Urine analysis:    Component Value Date/Time   COLORURINE YELLOW 07/30/2016 0908   APPEARANCEUR HAZY (A) 07/30/2016 0908   LABSPEC 1.025 07/30/2016 0908   PHURINE 5.0 07/30/2016 0908   GLUCOSEU NEGATIVE 07/30/2016 0908   HGBUR LARGE (A) 07/30/2016 0908   BILIRUBINUR NEGATIVE 07/30/2016 0908   KETONESUR NEGATIVE 07/30/2016 0908   PROTEINUR NEGATIVE 07/30/2016 0908   NITRITE NEGATIVE 07/30/2016 0908   LEUKOCYTESUR TRACE (A) 07/30/2016 0908   Sepsis Labs: @LABRCNTIP (procalcitonin:4,lacticidven:4) )No results found for this or any previous visit (from the past 240 hour(s)).   Radiological Exams on Admission: Ct Head Wo Contrast  Result Date: 05/01/2017 CLINICAL DATA:  Left side numbness, abnormal speech. EXAM: CT HEAD WITHOUT CONTRAST TECHNIQUE: Contiguous axial  images were obtained from the base of the skull through the vertex without intravenous contrast. COMPARISON:  07/30/2016 FINDINGS: Brain: No acute intracranial abnormality. Specifically, no hemorrhage, hydrocephalus, mass lesion, acute infarction, or significant intracranial injury. Vascular: No hyperdense vessel or unexpected calcification. Skull: No acute calvarial abnormality. Sinuses/Orbits: Visualized paranasal sinuses and mastoids clear. Orbital soft tissues unremarkable. Other: None IMPRESSION: Normal study. Electronically Signed   By: Charlett Nose M.D.   On: 05/01/2017 10:52     EKG: Independently reviewed.  Sinus rhythm, diffuse T wave changes that are unchanged as per EKG of January 2019  Assessment/Plan Principal Problem:   Left-sided weakness    Left-sided weakness -Differential diagnosis at this point includes stroke, TIA, complicated migraine, conversion disorder.  Initial neurology consultation is leaning towards conversion disorder pending normal stroke workup. -CT head is negative. -We will request MRI brain, carotid Dopplers, 2D echo. -Placed on aspirin for secondary stroke prophylaxis. -PT OT/ST consultations will be requested. -Patient failed bedside swallow screen in the ED hence will be kept n.p.o. pending ST evaluation.   DVT prophylaxis: Lovenox Code Status: Full code Family Communication: Patient only Disposition Plan: Anticipate discharge home in 24 hours Consults called: Neurology Admission status: Observation   Time Spent: 85 minutes  Amalie Koran Philip Aspen MD [facility] Pager [phone]  If 7PM-7AM, please contact night-coverage [email or web address] Password [email or web address]  05/01/2017, 1:39 PM

## 2017-05-01 NOTE — Progress Notes (Signed)
Arrived in room at 1300.  Alert and oriented .  Speech slow with some stuttering.  Left sided weakness continues with some left limb drift.  Smile equal.  Left grip somewhat weaker than right.  Transported later for carotied US.  Echo completed in room .Now gone for MRI. Significant other at bedside.

## 2017-05-01 NOTE — ED Triage Notes (Addendum)
Pt woke up this morning with some chest tightness,  Headache and c/o left side becoming numb.  States she went to bed last night and was achy and had a headache .

## 2017-05-01 NOTE — Consult Note (Signed)
   [facility] Consult Services  Impression: paresthesias/ stuttering, r/o stroke She has focal subjective paresthesias but no objective motor symptoms currently which is reassuring however, given the focal nature, suggest r/o stroke with MRI.  Her speech symptoms are atypical (especially the stuttering nature) for both acute stroke and complicated migraine so would also put conversion do in ddx.  Not a tpa candidate due to: outside treatment window Not an NIR candidate due to: no cortical or LVO symptoms  Comments:   Asked to see thiss patient as a STAT consult  Recommendations:   Antiplatelet therapy with Aspirin Stroke protocol w/u  Further inpatient evaluation as per Neurology/ Internal Medicine Discussed with ED MD  History of Present Illness   Patient is a 42 yo F with no sig PMH, takes no medications PW headache and left sided numbness with stuttering speech Her HA began last night and persisted throughout the night LKW last night prior to bed  She woke at 7 am and did not get out of bed due to headache. She was able to get back to sleep and then woke finally at 9 am. When she attempted to get out of bed at that point, she noted the left sided numbness.  HCT is negative.  No prior h/o similar symptoms. She denies frequent headaches. She denies h/o migraines.   Exam   NIHSS score:  Medical Decision Making:  - Extensive number of diagnosis or management options are considered above.   - Extensive amount of complex data reviewed.   - High risk of complication and/or morbidity or mortality are associated with differential diagnostic considerations above.  - There may be Uncertain outcome and increased probability of prolonged functional impairment or high probability of severe prolonged functional impairment associated with some of these differential diagnosis.  Medical Data Reviewed:  1.Data reviewed include clinical labs, radiology,  Medical Tests;     2.Tests results discussed w/performing or interpreting physician;   3.Obtaining/reviewing old medical records;  4.Obtaining case history from another source;  5.Independent review of image, tracing or specimen.

## 2017-05-01 NOTE — Progress Notes (Signed)
PT Cancellation Note  Patient Details Name: Morgan Lester MRN: [id number] DOB: 06-06-1975   Cancelled Treatment:    Reason Eval/Treat Not Completed: Patient at procedure or test/unavailable.  Patient having ultrasound done in room and not available.  Will check back tomorrow.   3:45 PM, 05/01/17 Ocie BobJames Jearlene Bridwell, MPT Physical Therapist with [facility] 336 [phone] office [phone] mobile phone

## 2017-05-01 NOTE — ED Notes (Signed)
Patient transported to CT 

## 2017-05-01 NOTE — ED Provider Notes (Signed)
[facility]ERGENCY DEPARTMENT Provider Note   CSN: [id number] Arrival date & time: 05/01/17  [id number]     History   Chief Complaint Chief Complaint  Patient presents with  . Numbness    HPI Delfin EdisJamie Millington is a 42 y.o. female.  HPI   42 year old female with left-sided numbness.  First noticed when she woke up this morning.  Last known normal before midnight yesterday.  When she woke up she noticed that the left side of her body seem to be numb and her legs seem weak.  Also with headache and some chest tightness.  Symptoms have been stable since onset.  No fevers or chills.  No acute visual complaints.  Past Medical History:  Diagnosis Date  . Guillain Barr syndrome (HCC)     There are no active problems to display for this patient.   Past Surgical History:  Procedure Laterality Date  . APPENDECTOMY    . TONSILLECTOMY      OB History    No data available       Home Medications    Prior to Admission medications   Medication Sig Start Date End Date Taking? Authorizing Provider  acetaminophen (TYLENOL) 500 MG tablet Take 500 mg by mouth every 6 (six) hours as needed for mild pain or moderate pain.    [provider]  benzonatate (TESSALON) 100 MG capsule Take 2 capsules (200 mg total) by mouth 3 (three) times daily as needed. 03/06/17   Burgess AmorIdol, Julie, PA-C  chlorpheniramine-HYDROcodone (TUSSIONEX PENNKINETIC ER) 10-8 MG/5ML SUER Take 5 mLs by mouth every 12 (twelve) hours as needed for cough. 03/06/17   Burgess AmorIdol, Julie, PA-C  ibuprofen (ADVIL,MOTRIN) 200 MG tablet Take 200 mg by mouth every 6 (six) hours as needed.    [provider]  meclizine (ANTIVERT) 25 MG tablet Take 1 tablet (25 mg total) by mouth 3 (three) times daily as needed for dizziness. 07/30/16   Bethann BerkshireZammit, Joseph, MD  ondansetron (ZOFRAN ODT) 4 MG disintegrating tablet 4mg  ODT q4 hours prn nausea/vomit 07/30/16   Bethann BerkshireZammit, Joseph, MD  predniSONE (DELTASONE) 50 MG tablet Take your next dose of prednisone  tomorrow evening. 03/07/17   Burgess AmorIdol, Julie, PA-C    Family History History reviewed. No pertinent family history.  Social History Social History   Tobacco Use  . Smoking status: Never Smoker  . Smokeless tobacco: Never Used  Substance Use Topics  . Alcohol use: No  . Drug use: No     Allergies   Influenza vaccines and Tape   Review of Systems Review of Systems  All systems reviewed and negative, other than as noted in HPI.   Physical Exam Updated Vital Signs BP 125/82 (BP Location: Right Arm)   Pulse 90   Temp 98.2 F (36.8 C) (Oral)   Resp 15   Ht 5\' 7"  (1.702 m)   Wt 99.8 kg (220 lb)   SpO2 98%   BMI 34.46 kg/m   Physical Exam  Constitutional: She is oriented to person, place, and time. She appears well-developed and well-nourished. No distress.  HENT:  Head: Normocephalic and atraumatic.  Eyes: Conjunctivae are normal. Right eye exhibits no discharge. Left eye exhibits no discharge.  Neck: Neck supple.  Cardiovascular: Normal rate, regular rhythm and normal heart sounds. Exam reveals no gallop and no friction rub.  No murmur heard. Pulmonary/Chest: Effort normal and breath sounds normal. No respiratory distress.  Abdominal: Soft. She exhibits no distension. There is no tenderness.  Musculoskeletal: She exhibits no  edema or tenderness.  Neurological: She is alert and oriented to person, place, and time.  Speech is stuttering at times.  Not really dysarthric or aphasic though.  Cranial nerves II through XII are intact.  Strength is 5 out of 5 right side.  4 out of 5 left upper extremity.  3 out of 5 left lower extremity.  Decreased sensation light touch left upper lower extremity.  Intact elsewhere.  Patient was able to transfer from wheelchair to her stretcher unassisted.  Skin: Skin is warm and dry.  Psychiatric: She has a normal mood and affect. Her behavior is normal. Thought content normal.  Nursing note and vitals reviewed.    ED Treatments / Results    Labs (all labs ordered are listed, but only abnormal results are displayed) Labs Reviewed  LIPID PANEL - Abnormal; Notable for the following components:      Result Value   Cholesterol 201 (*)    HDL 35 (*)    LDL Cholesterol 136 (*)    All other components within normal limits  CBC WITH DIFFERENTIAL/PLATELET  BASIC METABOLIC PANEL  HIV ANTIBODY (ROUTINE TESTING)  HEMOGLOBIN A1C    EKG  EKG Interpretation None       Radiology Ct Head Wo Contrast  Result Date: 05/01/2017 CLINICAL DATA:  Left side numbness, abnormal speech. EXAM: CT HEAD WITHOUT CONTRAST TECHNIQUE: Contiguous axial images were obtained from the base of the skull through the vertex without intravenous contrast. COMPARISON:  07/30/2016 FINDINGS: Brain: No acute intracranial abnormality. Specifically, no hemorrhage, hydrocephalus, mass lesion, acute infarction, or significant intracranial injury. Vascular: No hyperdense vessel or unexpected calcification. Skull: No acute calvarial abnormality. Sinuses/Orbits: Visualized paranasal sinuses and mastoids clear. Orbital soft tissues unremarkable. Other: None IMPRESSION: Normal study. Electronically Signed   By: Charlett Nose M.D.   On: 05/01/2017 10:52    Procedures Procedures (including critical care time)  Medications Ordered in ED Medications - No data to display   Initial Impression / Assessment and Plan / ED Course  I have reviewed the triage vital signs and the nursing notes.  Pertinent labs & imaging results that were available during my care of the patient were reviewed by me and considered in my medical decision making (see chart for details).     42 year old female with left-sided numbness/weakness.  Presented outside of window for TPA.  No symptoms of large vessel occlusion.  I suspect there may be some psychogenic overtones but I cannot completely rule out organic disease.  She was evaluated by neurology who had some similar concerns but cannot rule out  organic disease.  Will admit for further workup.  Final Clinical Impressions(s) / ED Diagnoses   Final diagnoses:  TIA (transient ischemic attack)    ED Discharge Orders    None       Raeford Razor, MD 05/07/17 1216

## 2017-05-01 NOTE — ED Notes (Signed)
Pt speaking with neurologist

## 2017-05-01 NOTE — Evaluation (Signed)
Clinical/Bedside Swallow Evaluation Patient Details  Name: Delfin EdisJamie Crumby MRN: [id number] Date of Birth: 1975-05-19  Today's Date: 05/01/2017 Time: SLP Start Time (ACUTE ONLY): 1735 SLP Stop Time (ACUTE ONLY): 1810 SLP Time Calculation (min) (ACUTE ONLY): 35 min  Past Medical History:  Past Medical History:  Diagnosis Date  . Guillain Barr syndrome [facility](HCC)    Past Surgical History:  Past Surgical History:  Procedure Laterality Date  . APPENDECTOMY    . TONSILLECTOMY     HPI:  Pt is a 42 y.o. female without significant PMH, admitted 3/8 with L side weakness and dysarthria. MD note indicated "Neurology believes stroke needs to be ruled out, however with atypical speech symptoms conversion disorder needs to be in the differential diagnosis." Reportedly pt's speech characterized by stuttering. Pt failed RN stroke swallow screen due to difficulty with cracker. Pt currently NPO; however, bedside swallow evaluation has been removed. Cognitive linguistic eval ordered.   Assessment / Plan / Recommendation Clinical Impression  Pt reported no immediate overt s/s of aspiration. She did note occasionally feeling like food was "getting caught" in throat but attributed this to dryness. Pt did have an intermittent, dry cough but this was occurring before any PO intake and did not appear to be related to swallow function. Aspiration risk appears mild at this time. Recommend initiating regular diet, thin liquids, meds whole with liquid, cue pt for small bites/ sips and ensure upright position for meals. SLP will sign off for swallow orders; please re-consult if needs arise. SLP Visit Diagnosis: Dysphagia, unspecified (R13.10)    Aspiration Risk  Mild aspiration risk    Diet Recommendation Regular;Thin liquid   Liquid Administration via: Cup;Straw Medication Administration: Whole meds with liquid Supervision: Patient able to self feed Compensations: Slow rate;Small sips/bites Postural Changes: Seated  upright at 90 degrees    Other  Recommendations Oral Care Recommendations: Oral care BID   Follow up Recommendations Other (comment)(o/p for speech production)      Frequency and Duration            Prognosis        Swallow Study   General HPI: Pt is a 42 y.o. female without significant PMH, admitted 3/8 with L side weakness and dysarthria. MD note indicated "Neurology believes stroke needs to be ruled out, however with atypical speech symptoms conversion disorder needs to be in the differential diagnosis." Reportedly pt's speech characterized by stuttering. Pt failed RN stroke swallow screen due to difficulty with cracker. Pt currently NPO; however, bedside swallow evaluation has been removed. Cognitive linguistic eval ordered. Type of Study: Bedside Swallow Evaluation Previous Swallow Assessment: none in chart Diet Prior to this Study: NPO Temperature Spikes Noted: No Respiratory Status: Room air History of Recent Intubation: No Behavior/Cognition: Alert;Cooperative;Pleasant mood Oral Cavity Assessment: Within Functional Limits Oral Cavity - Dentition: Adequate natural dentition Vision: Functional for self-feeding Self-Feeding Abilities: Able to feed self Patient Positioning: Upright in bed Baseline Vocal Quality: Normal Volitional Cough: Strong Volitional Swallow: Able to elicit    Oral/Motor/Sensory Function Overall Oral Motor/Sensory Function: Within functional limits   Ice Chips Ice chips: Not tested   Thin Liquid Thin Liquid: Within functional limits Presentation: Cup;Straw    Nectar Thick Nectar Thick Liquid: Not tested   Honey Thick Honey Thick Liquid: Not tested   Puree Puree: Not tested   Solid   GO   Solid: Within functional limits Presentation: Self Fed        Amy Cecille AverK Oleksiak, MA, CCC-SLP 05/01/2017,6:18 PM

## 2017-05-01 NOTE — ED Notes (Addendum)
Neuro paged. Machine broke into room. Awaiting neurologist

## 2017-05-01 NOTE — ED Notes (Signed)
Report to Nancy, RN.

## 2017-05-01 NOTE — Evaluation (Addendum)
Speech Language Pathology Evaluation Patient Details Name: Morgan Lester MRN: [id number] DOB: 01/20/1976 Today's Date: 05/01/2017 Time: 1735-1810 SLP Time Calculation (min) (ACUTE ONLY): 35 min  Problem List:  Patient Active Problem List   Diagnosis Date Noted  . Left-sided weakness 05/01/2017   Past Medical History:  Past Medical History:  Diagnosis Date  . Guillain Barr syndrome [facility](HCC)    Past Surgical History:  Past Surgical History:  Procedure Laterality Date  . APPENDECTOMY    . TONSILLECTOMY     HPI:  Pt is a 42 y.o. female without significant PMH, admitted 3/8 with L side weakness and dysarthria. MD note indicated "Neurology believes stroke needs to be ruled out, however with atypical speech symptoms conversion disorder needs to be in the differential diagnosis." Reportedly pt's speech characterized by stuttering. Pt failed RN stroke swallow screen due to difficulty with cracker. Pt currently NPO; however, bedside swallow evaluation has been removed. Cognitive linguistic eval ordered.   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 Stuttering is primarily characterized by prolongations and occasional groping for words most often at the beginning of phrases/ sentences. Otherwise, pt's cognitive-linguistic skills are within normal limits and speech is intelligible; pt scored a 29/30 on the MoCA. Pt reports "feeling a little off" overall with cognitive skills; however, this was not evident for skills assessed during this evaluation. Pt would benefit from continued speech therapy acutely and on an outpatient basis if stuttering continues, in order to monitor progress and review speech fluency strategies to increase functional communication skills. 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 This date, reviewed the following strategies:  easy onset at beginning of sentences/ phrases, thinking through a sentence before verbalizing it. SLP will continue to follow.    SLP Assessment  SLP Recommendation/Assessment: Patient needs continued Speech Lanaguage Pathology Services SLP Visit Diagnosis: Dysarthria and anarthria (R47.1);Other (comment)(dysarthria judged to be the closest dx to stuttering)    Follow Up Recommendations  Outpatient SLP  Frequency and Duration min 1 x/week  2 weeks      SLP Evaluation Cognition  Overall Cognitive Status: Within Functional Limits for tasks assessed Arousal/Alertness: Awake/alert Orientation Level: Oriented X4 Attention: Alternating Alternating Attention: Appears intact Memory: Appears intact Awareness: Appears intact Problem Solving: Appears intact Executive Function: Sequencing Sequencing: Appears intact Safety/Judgment: Appears intact       Comprehension  Auditory Comprehension Overall Auditory Comprehension: Appears within functional limits for tasks assessed Yes/No Questions: Within Functional Limits Commands: Within Functional Limits Conversation: Complex Reading Comprehension Reading Status: Not tested    Expression Expression Primary Mode of Expression: Verbal Verbal Expression Overall Verbal Expression: Impaired Initiation: Impaired(due to stuttering) Level of Generative/Spontaneous Verbalization: Conversation Repetition: No impairment Naming: No impairment Pragmatics: No impairment Non-Verbal Means of Communication: Not applicable Written Expression Dominant Hand: Right   Oral / Motor  Oral Motor/Sensory Function Overall Oral Motor/Sensory Function: Within functional limits Motor Speech Overall Motor Speech: Impaired Respiration: Within functional limits Phonation: Normal Resonance: Within functional limits Articulation: Within functional limitis Motor Planning: (stuttering) Effective Techniques: Slow rate   GO                    Metro KungAmy K Marlo Arriola,  MA, CCC-SLP 05/01/2017, 6:29 PM

## 2017-05-01 NOTE — Progress Notes (Signed)
*  PRELIMINARY RESULTS* Echocardiogram 2D Echocardiogram has been performed.  Stacey DrainWhite, Argenis Kumari J 05/01/2017, 4:09 PM

## 2017-05-01 NOTE — ED Notes (Signed)
Neurologist responded

## 2017-05-02 LAB — LIPID PANEL
CHOLESTEROL: 201 mg/dL — AB (ref 0–200)
HDL: 35 mg/dL — ABNORMAL LOW (ref >40)
LDL Cholesterol: 136 mg/dL — ABNORMAL HIGH (ref 0–99)
Total CHOL/HDL Ratio: 5.7 RATIO
Triglycerides: 149 mg/dL (ref <150)
VLDL: 30 mg/dL (ref 0–40)

## 2017-05-02 LAB — HIV ANTIBODY (ROUTINE TESTING W REFLEX): HIV Screen 4th Generation wRfx: NONREACTIVE

## 2017-05-02 MED ORDER — ASPIRIN EC 81 MG PO TBEC: 81.0000 mg | DELAYED_RELEASE_TABLET | Freq: Every day | ORAL | Status: AC

## 2017-05-02 NOTE — Discharge Summary (Signed)
Physician Discharge Summary  Morgan Lester XLK:[id number] DOB: 12-Dec-1975 DOA: 05/01/2017  PCP: Patient, No Pcp Per  Admit date: 05/01/2017 Discharge date: 05/02/2017  Time spent: 45 minutes  Recommendations for Outpatient Follow-up:  -Will be discharged home today. -Advise follow-up with PCP in 2 weeks, if at time of follow-up continues to have stuttering may benefit from outpatient speech therapy follow-up.  Discharge Diagnoses:  Principal Problem:   Left-sided weakness   Discharge Condition: Stable and improved  Filed Weights   05/01/17 0956  Weight: 99.8 kg (220 lb)    History of present illness:  Morgan Lester is a 42 y.o. female without significant past medical history who comes to the hospital today with left-sided weakness and dysarthria that began this morning.  She states she went to bed feeling normal and woke up this morning with the symptoms.  She has already been seen by neurology, she is not a TPA candidate due to unclear onset of symptoms.  She states she had a significant headache at onset of symptoms as well.  Neurology believes stroke needs to be ruled out, however with atypical speech symptoms conversion disorder needs to be in the differential diagnosis.  Admission is requested.    Hospital Course:   Left-sided weakness, dysarthria -Still present today although improved. -MRI brain is normal, no abnormalities found on carotid Dopplers or on 2D echo. -Has been started on aspirin. -Has not had a headache. -There is some suspicion that she might have a conversion disorder causing the symptoms as she does not appear to have any anatomical/organic central nervous system issues. -She does have hyperlipidemia with an LDL of 136, however I believe that it is appropriate for her to initiate lifestyle modifications rather than statin medications for now given her lack of significant coronary artery disease/stroke risk factors in the absence of either of these  diseases.  Procedures: ECHO: Left ventricle: The cavity size was normal. Wall thickness was   increased in a pattern of mild LVH. Systolic function was normal.   The estimated ejection fraction was in the range of 55% to 60%.   Wall motion was normal; there were no regional wall motion   abnormalities. Left ventricular diastolic function parameters    were normal. Carotid dopplers: Normal carotid artery duplex. No evidence for carotid artery plaque  or stenosis.  Consultations:  Tele neurology  Discharge Instructions  Discharge Instructions    Diet - low sodium heart healthy   Complete by:  As directed    Increase activity slowly   Complete by:  As directed      Allergies as of 05/02/2017      Reactions   Influenza Vaccines Other (See Comments)   GBS   Tape Rash   Silk tape      Medication List    TAKE these medications   aspirin EC 81 MG tablet Take 1 tablet (81 mg total) by mouth daily.   ibuprofen 200 MG tablet Commonly known as:  ADVIL,MOTRIN Take 800 mg by mouth every 8 (eight) hours as needed for moderate pain.      Allergies  Allergen Reactions  . Influenza Vaccines Other (See Comments)    GBS  . Tape Rash    Silk tape   Follow-up Information    your regular physician. Schedule an appointment as soon as possible for a visit in 2 week(s).            The results of significant diagnostics from this hospitalization (including  imaging, microbiology, ancillary and laboratory) are listed below for reference.    Significant Diagnostic Studies: Ct Head Wo Contrast  Result Date: 05/01/2017 CLINICAL DATA:  Left side numbness, abnormal speech. EXAM: CT HEAD WITHOUT CONTRAST TECHNIQUE: Contiguous axial images were obtained from the base of the skull through the vertex without intravenous contrast. COMPARISON:  07/30/2016 FINDINGS: Brain: No acute intracranial abnormality. Specifically, no hemorrhage, hydrocephalus, mass lesion, acute infarction, or  significant intracranial injury. Vascular: No hyperdense vessel or unexpected calcification. Skull: No acute calvarial abnormality. Sinuses/Orbits: Visualized paranasal sinuses and mastoids clear. Orbital soft tissues unremarkable. Other: None IMPRESSION: Normal study. Electronically Signed   By: Charlett NoseKevin  Dover M.D.   On: 05/01/2017 10:52   Mr Brain Wo Contrast  Result Date: 05/01/2017 CLINICAL DATA:  Left-sided numbness and slurred speech. EXAM: MRI HEAD WITHOUT CONTRAST TECHNIQUE: Multiplanar, multiecho pulse sequences of the brain and surrounding structures were obtained without intravenous contrast. COMPARISON:  Head CT from earlier today FINDINGS: Brain: No acute infarction, hemorrhage, hydrocephalus, extra-axial collection or mass lesion. Vascular: Negative Skull and upper cervical spine: No evidence of marrow lesion. Sinuses/Orbits: Presumed retention cyst in the inferior right maxillary sinus. IMPRESSION: Negative brain MRI. Electronically Signed   By: Marnee SpringJonathon  Watts M.D.   On: 05/01/2017 17:21   [place] Carotid Bilateral (at [facility] And [facility] Only)  Result Date: 05/01/2017 CLINICAL DATA:  Headache and chest tightness. EXAM: BILATERAL CAROTID DUPLEX ULTRASOUND TECHNIQUE: Wallace CullensGray scale imaging, color Doppler and duplex ultrasound were performed of bilateral carotid and vertebral arteries in the neck. COMPARISON:  None. FINDINGS: Criteria: Quantification of carotid stenosis is based on velocity parameters that correlate the residual internal carotid diameter with NASCET-based stenosis levels, using the diameter of the distal internal carotid lumen as the denominator for stenosis measurement. The following velocity measurements were obtained: RIGHT ICA:  105 cm/sec CCA:  121 cm/sec SYSTOLIC ICA/CCA RATIO:  0.9 DIASTOLIC ICA/CCA RATIO:  1.6 ECA:  111 cm/sec LEFT ICA:  97 cm/sec CCA:  101 cm/sec SYSTOLIC ICA/CCA RATIO:  1.0 DIASTOLIC ICA/CCA RATIO:  1.1 ECA:  78 cm/sec RIGHT CAROTID ARTERY: Right carotid arteries are  patent without significant plaque or stenosis. Normal waveforms and velocities in the internal carotid artery. External carotid artery is patent with normal waveform. RIGHT VERTEBRAL ARTERY: Antegrade flow and normal waveform in the right vertebral artery. LEFT CAROTID ARTERY: Left carotid arteries are patent without significant plaque or stenosis. Normal waveforms and velocities in the internal carotid artery. External carotid artery is patent with normal waveform. LEFT VERTEBRAL ARTERY: Antegrade flow and normal waveform in the left vertebral artery. IMPRESSION: Normal carotid artery duplex. No evidence for carotid artery plaque or stenosis. Electronically Signed   By: Richarda OverlieAdam  Henn M.D.   On: 05/01/2017 14:32    Microbiology: No results found for this or any previous visit (from the past 240 hour(s)).   Labs: Basic Metabolic Panel: Recent Labs  Lab 05/01/17 1037  NA 140  K 3.8  CL 105  CO2 24  GLUCOSE 90  BUN 14  CREATININE 0.71  CALCIUM 9.2   Liver Function Tests: No results for input(s): AST, ALT, ALKPHOS, BILITOT, PROT, ALBUMIN in the last 168 hours. No results for input(s): LIPASE, AMYLASE in the last 168 hours. No results for input(s): AMMONIA in the last 168 hours. CBC: Recent Labs  Lab 05/01/17 1037  WBC 5.4  NEUTROABS 3.3  HGB 12.5  HCT 39.2  MCV 90.5  PLT 363   Cardiac Enzymes: No results for input(s): CKTOTAL, CKMB, CKMBINDEX,  TROPONINI in the last 168 hours. BNP: BNP (last 3 results) No results for input(s): BNP in the last 8760 hours.  ProBNP (last 3 results) No results for input(s): PROBNP in the last 8760 hours.  CBG: No results for input(s): GLUCAP in the last 168 hours.     Signed:  Chaya Jan  [facility] Pager: [phone] 05/02/2017, 11:34 AM

## 2017-05-02 NOTE — Evaluation (Signed)
Physical Therapy Evaluation Patient Details Name: Morgan Lester MRN: [id number] DOB: 08/20/1975 Today's Date: 05/02/2017   History of Present Illness  Pt is a 42 y.o. female without significant PMH, admitted 3/8 with L side weakness and dysarthria. MD note indicated "Neurology believes stroke needs to be ruled out, however with atypical speech symptoms conversion disorder needs to be in the differential diagnosis." Reportedly pt's speech characterized by stuttering. Pt failed RN stroke swallow screen due to difficulty with cracker. Pt currently NPO; however, bedside swallow evaluation has been removed. Cognitive linguistic eval ordered.      Clinical Impression  Morgan Lester is a 42 y.o. presenting for PT evaluation with recent decrease in functional mobility secondary to recent onset of left sided weakness. She is currently functioning close to her baseline of independent with no device to mobilize, and now requires extra time to perform functional mobility and is ambulating at a gait velocity below her age norms. She has mild left sided weakness in her UE and LE and mild coordination deficits with specific testing. She has been educated on exercises to perform at home is comfortable returning home with assistance from her significant other. I anticipate she will recover well when medically ready to be discharged home and will discharge to nursing for further care. Re-consult acute PT if there is a change in functional status.      Follow Up Recommendations No PT follow up    Equipment Recommendations       Recommendations for Other Services       Precautions / Restrictions Precautions Precautions: Fall Restrictions Weight Bearing Restrictions: No      Mobility  Bed Mobility Overal bed mobility: Independent       Transfers Overall transfer level: Independent     Ambulation/Gait Ambulation/Gait assistance: Modified independent (Device/Increase time) Ambulation Distance (Feet):  100 Feet   Gait Pattern/deviations: Step-through pattern;Decreased stance time - left;Decreased step length - right;Decreased dorsiflexion - left;Wide base of support   Gait velocity interpretation: Below normal speed for age/gender    Modified Rankin (Stroke Patients Only) Modified Rankin (Stroke Patients Only) Pre-Morbid Rankin Score: No symptoms Modified Rankin: No significant disability     Balance Overall balance assessment: Independent Sitting-balance support: No upper extremity supported;Feet supported Sitting balance-Leahy Scale: Normal     Standing balance support: No upper extremity supported;During functional activity Standing balance-Leahy Scale: Good   Single Leg Stance - Right Leg: 0 Single Leg Stance - Left Leg: 0    High level balance activites: Turns;Head turns High Level Balance Comments: patient maintains balance with horizontal and vertical head turns during gait, as well as during 90 and 180 degreee turns        Pertinent Vitals/Pain Pain Assessment: 0-10 Pain Score: 6  Pain Location: left forearm Pain Descriptors / Indicators: Aching Pain Intervention(s): Monitored during session    Home Living Family/patient expects to be discharged to:: Private residence Living Arrangements: Spouse/significant other Available Help at Discharge: Family;Friend(s) Type of Home: House Home Access: Stairs to enter Entrance Stairs-Rails: Can reach both(pillar on each side) Entrance Stairs-Number of Steps: 3 Home Layout: One level Home Equipment: Cane - single point      Prior Function Level of Independence: Independent     Comments: Patient ambulating with no device, working full time and driving, as well as performing all ADL's and homemaking independently     Hand Dominance   Dominant Hand: Right    Extremity/Trunk Assessment   Upper Extremity Assessment Upper Extremity Assessment: LUE  deficits/detail;RUE deficits/detail RUE Deficits / Details: 4+/5  throughout no sensation or coordination deficits LUE Deficits / Details: 3+/5 for shoulder flexion and abduction, 4+/5 for distal muscle groups tested; complaint of paresthesia in left forearm, slow movement and decreased accuracy with finger to nose and rapid alternating movements. LUE Coordination: decreased gross motor    Lower Extremity Assessment Lower Extremity Assessment: RLE deficits/detail;LLE deficits/detail RLE Deficits / Details: 4+/5 throughout no sensation or coordination deficits LLE Deficits / Details: 4-/5 throughuot; slow movement and decreased accuracy with heel to shin and rapid alternating movements. LLE Coordination: decreased gross motor    Cervical / Trunk Assessment Cervical / Trunk Assessment: Normal  Communication   Communication: Other (comment)(stutter)  Cognition Arousal/Alertness: Awake/alert Behavior During Therapy: WFL for tasks assessed/performed Overall Cognitive Status: Within Functional Limits for tasks assessed          Exercises General Exercises - Lower Extremity Long Arc Quad: AROM;Both;10 reps;Seated(3 second holds) Hip Flexion/Marching: Seated;AROM;Both;10 reps(3 second holds)   Assessment/Plan    PT Assessment Patent does not need any further PT services  PT Problem List         PT Treatment Interventions      PT Goals (Current goals can be found in the Care Plan section)  Acute Rehab PT Goals Patient Stated Goal: return home PT Goal Formulation: With patient/family Time For Goal Achievement: 05/04/17 Potential to Achieve Goals: Good     AM-PAC PT "6 Clicks" Daily Activity  Outcome Measure Difficulty turning over in bed (including adjusting bedclothes, sheets and blankets)?: None Difficulty moving from lying on back to sitting on the side of the bed? : None Difficulty sitting down on and standing up from a chair with arms (e.g., wheelchair, bedside commode, etc,.)?: None Help needed moving to and from a bed to chair  (including a wheelchair)?: None Help needed walking in hospital room?: A Little Help needed climbing 3-5 steps with a railing? : A Little 6 Click Score: 22    End of Session Equipment Utilized During Treatment: Gait belt Activity Tolerance: Patient tolerated treatment well Patient left: in bed;with family/visitor present;with call bell/phone within reach Nurse Communication: Mobility status PT Visit Diagnosis: Unsteadiness on feet (R26.81);Other abnormalities of gait and mobility (R26.89);Muscle weakness (generalized) (M62.81)    Time: [id number] PT Time Calculation (min) (ACUTE ONLY): 30 min   Charges:   PT Evaluation $PT Eval Moderate Complexity: 1 Mod PT Treatments $Therapeutic Exercise: 8-22 mins   PT G Codes:        Valentino Saxonachel Quinn-Brown, PT, DPT Physical Therapist with [facility]  05/02/2017 11:49 AM

## 2017-05-02 NOTE — Progress Notes (Signed)
Patient states understanding of discharge instructions.  

## 2017-05-03 LAB — HEMOGLOBIN A1C
Hgb A1c MFr Bld: 5.1 % (ref 4.8–5.6)
Mean Plasma Glucose: 100 mg/dL

## 2019-01-13 IMAGING — CT CT HEAD W/O CM
3 series · 15 of 47 positions shown, 18 images · non-contrast
Comparison: 07/30/2016

CLINICAL DATA: Left side numbness, abnormal speech.

EXAM:
CT HEAD WITHOUT CONTRAST
TECHNIQUE: Contiguous axial images were obtained from the base of the skull
through the vertex without intravenous contrast.

[Series 2: head wo · axial · 0.44mm/px · z∈[+61,+186]mm · 9 of 30 slices shown, 12 images]
[im 3/30  brain]
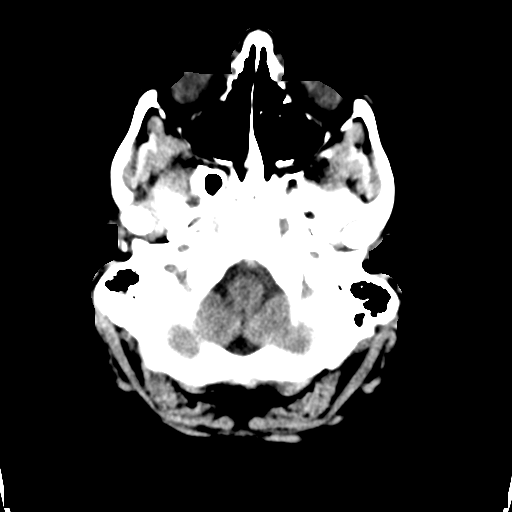
[im 3/30  bone]
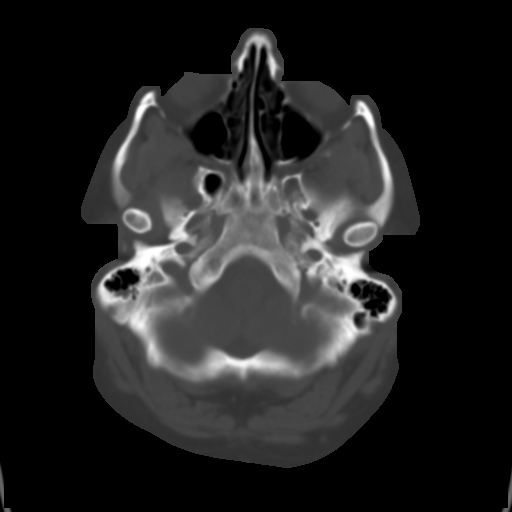
[im 6/30  brain]
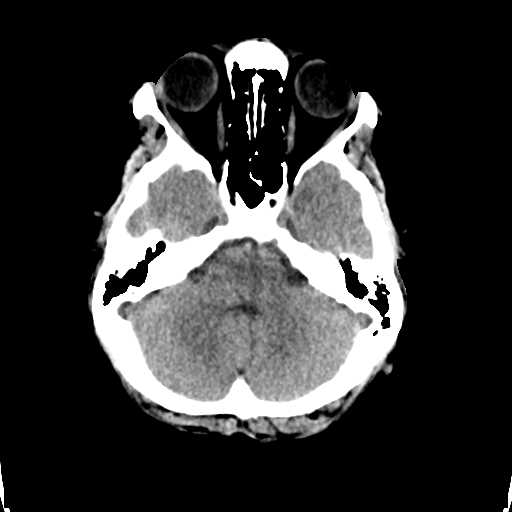
[im 9/30  brain]
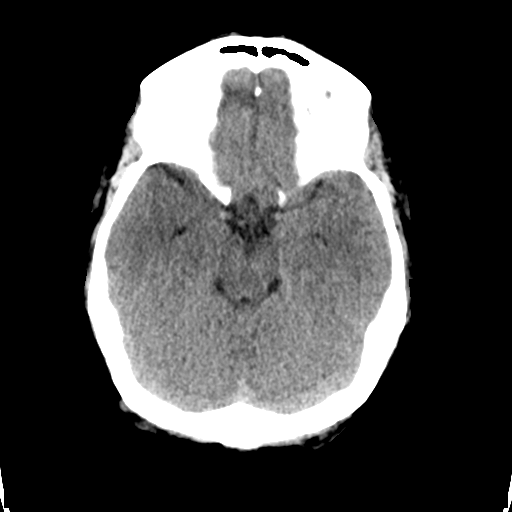
[im 12/30  brain]
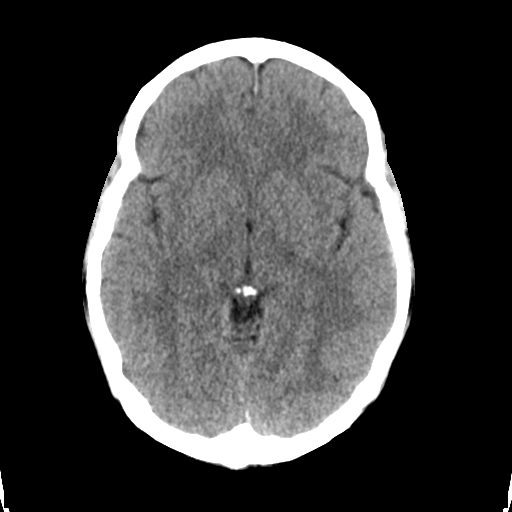
[im 16/30  brain]
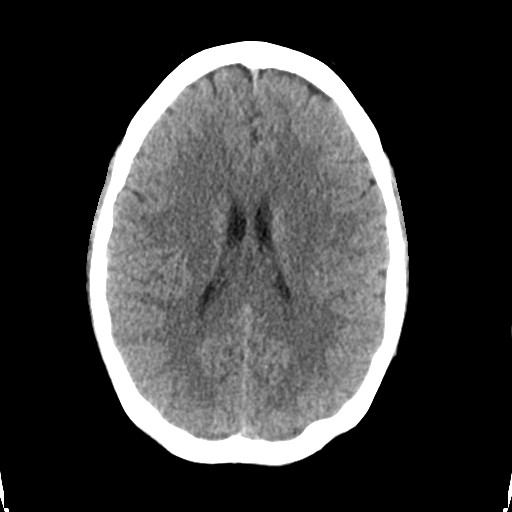
[im 16/30  bone]
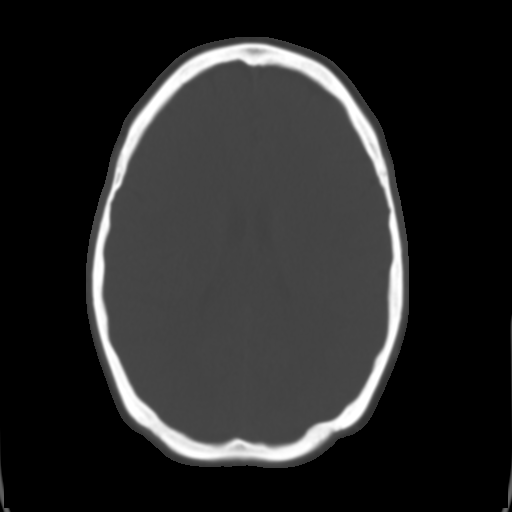
[im 19/30  brain]
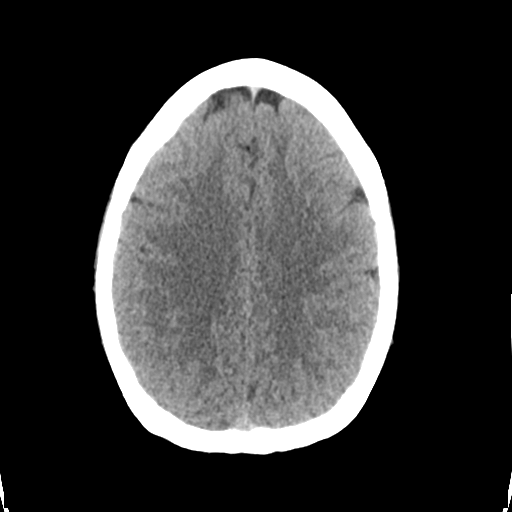
[im 22/30  brain]
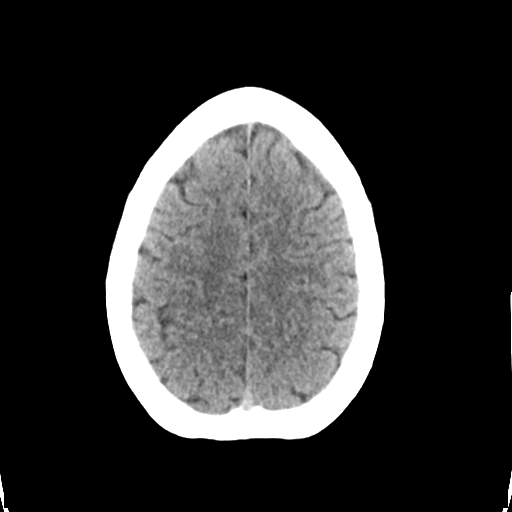
[im 25/30  brain]
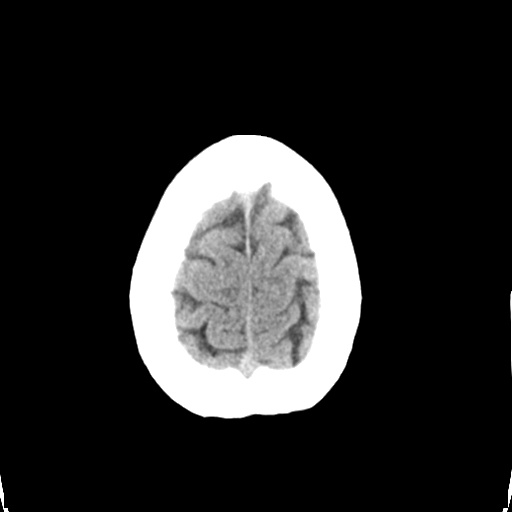
[im 28/30  brain]
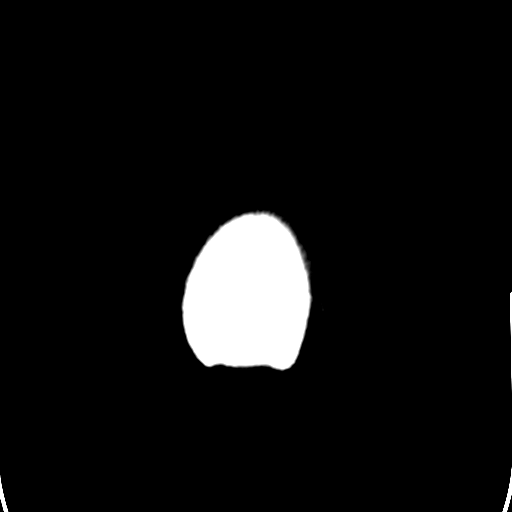
[im 28/30  bone]
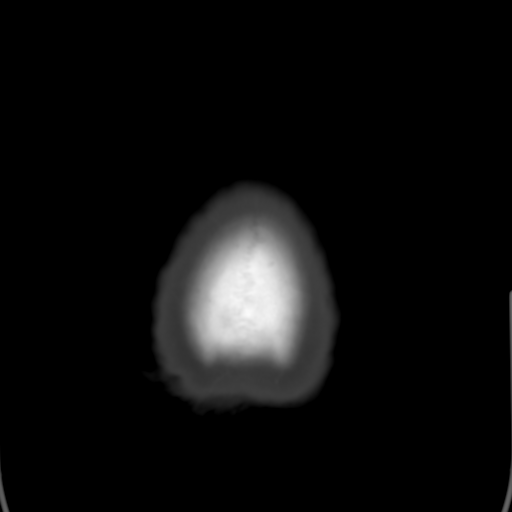

[Series 4: coronal soft tissue · coronal · 0.32mm/px · 3 of 67 slices shown]
[im 23/67  brain]
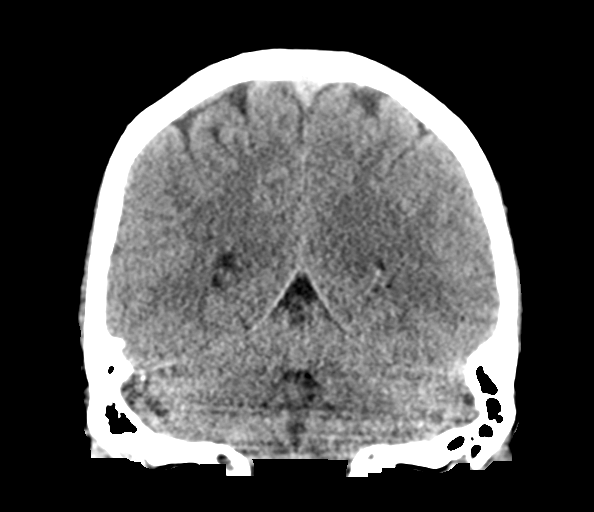
[im 30/67  brain]
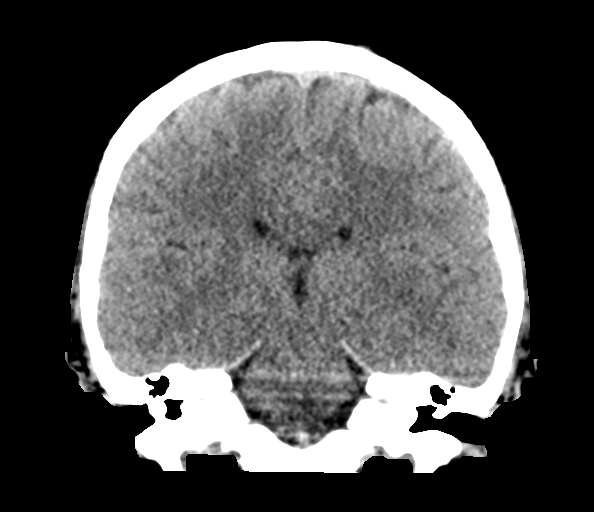
[im 37/67  brain]
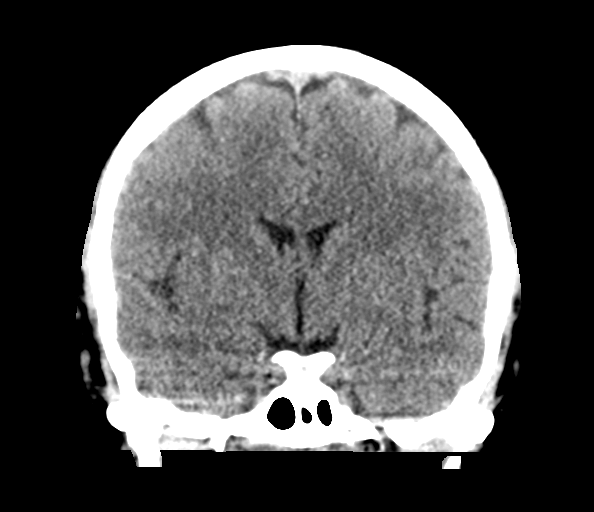

[Series 5: sagittal soft tissue · sagittal · 0.31mm/px · 3 of 54 slices shown]
[im 18/54  brain]
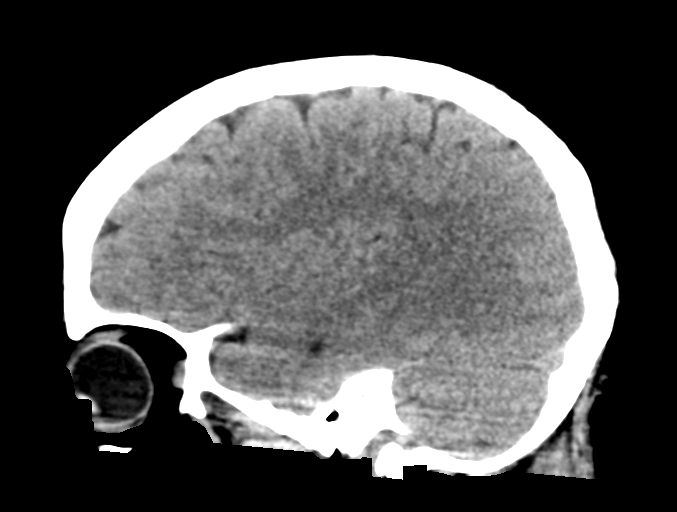
[im 27/54  brain]
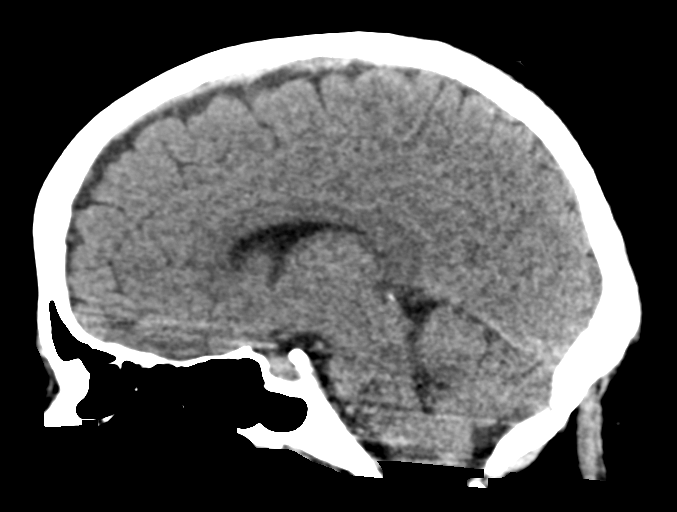
[im 36/54  brain]
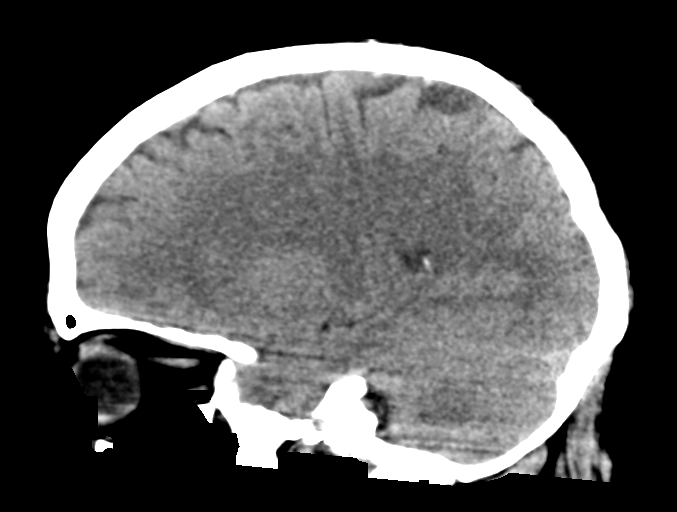

[15 of 47 positions shown; findings below may reference images not displayed]

FINDINGS: Brain: No acute intracranial abnormality. Specifically, no
hemorrhage, hydrocephalus, mass lesion, acute infarction, or
significant intracranial injury.

Vascular: No hyperdense vessel or unexpected calcification.

Skull: No acute calvarial abnormality.

Sinuses/Orbits: Visualized paranasal sinuses and mastoids clear.
Orbital soft tissues unremarkable.

Other: None
IMPRESSION: Normal study.

## 2020-01-10 IMAGING — US US CAROTID DUPLEX BILAT
1 series · 13 of 24 positions shown · non-contrast
Comparison: None.

CLINICAL DATA: Headache and chest tightness.

EXAM:
BILATERAL CAROTID DUPLEX ULTRASOUND
TECHNIQUE: Gray scale imaging, color Doppler and duplex ultrasound were
performed of bilateral carotid and vertebral arteries in the neck.

[Series 1: us carotid duplex bilat · 0.06mm/px · 13 of 68 slices shown]
[im 1/68]
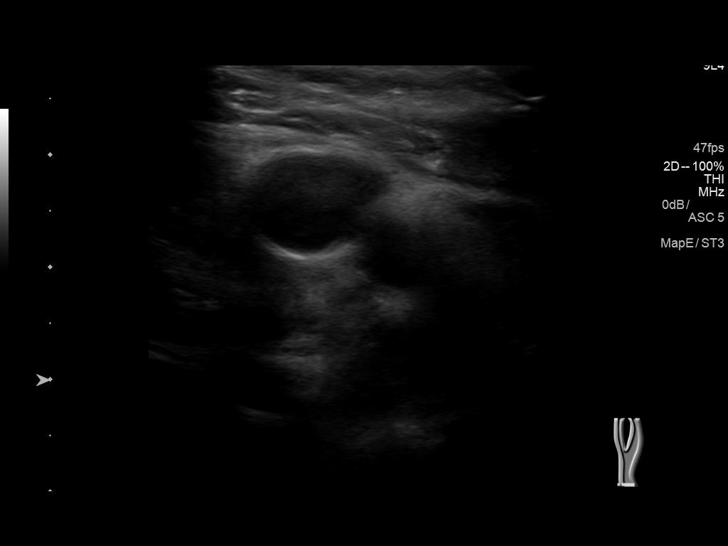
[im 6/68]
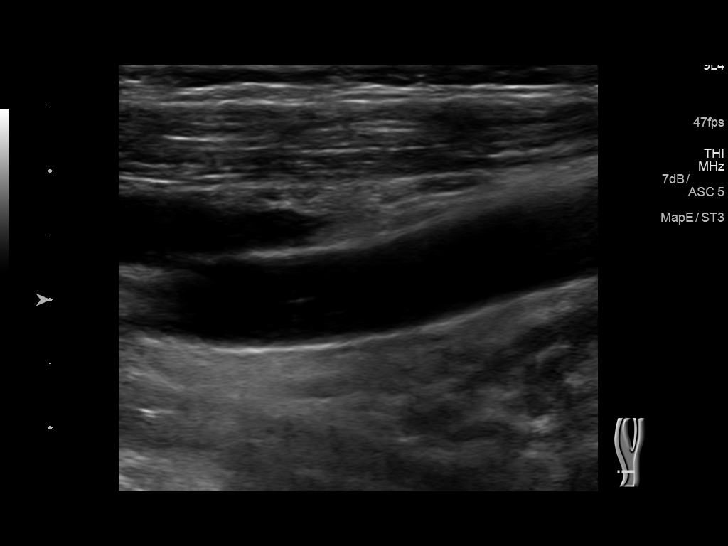
[im 12/68]
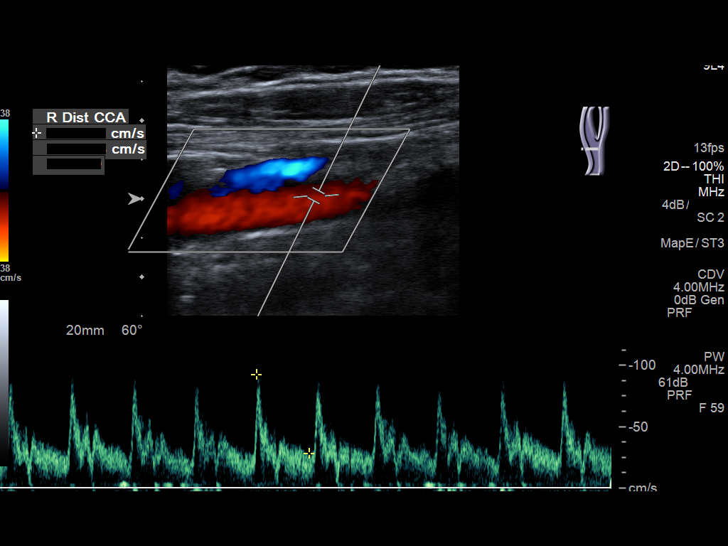
[im 18/68]
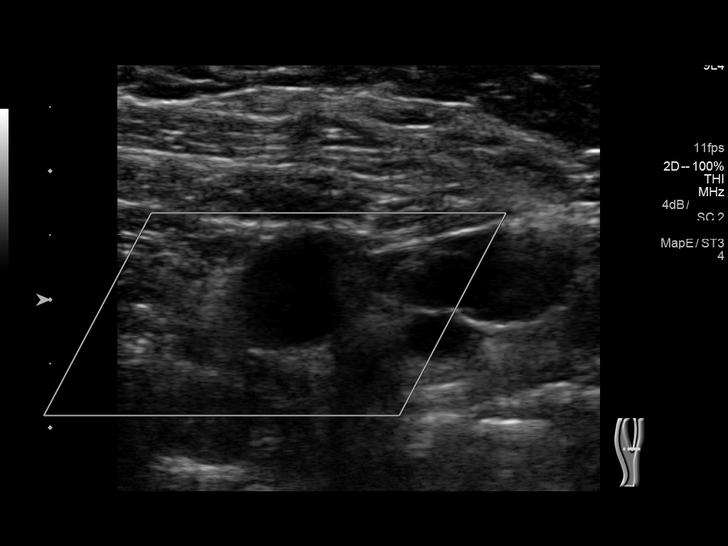
[im 24/68]
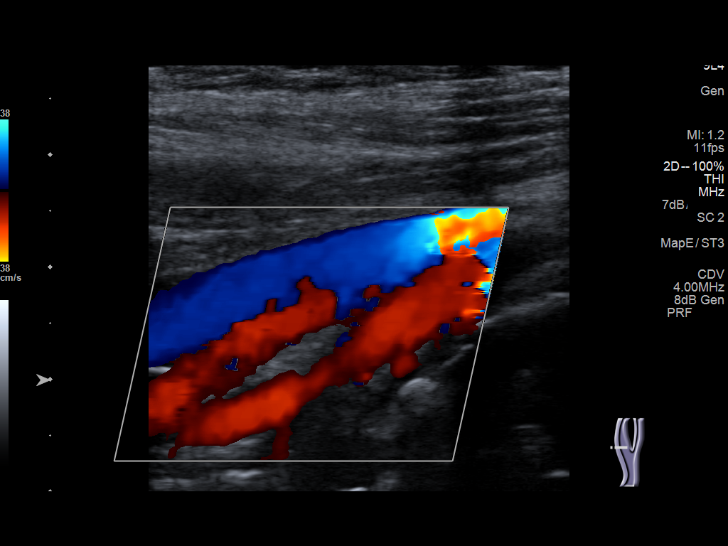
[im 30/68]
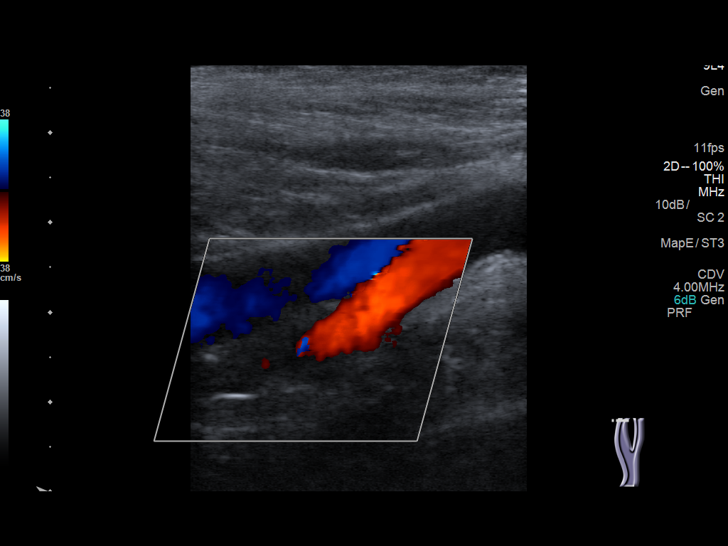
[im 35/68]
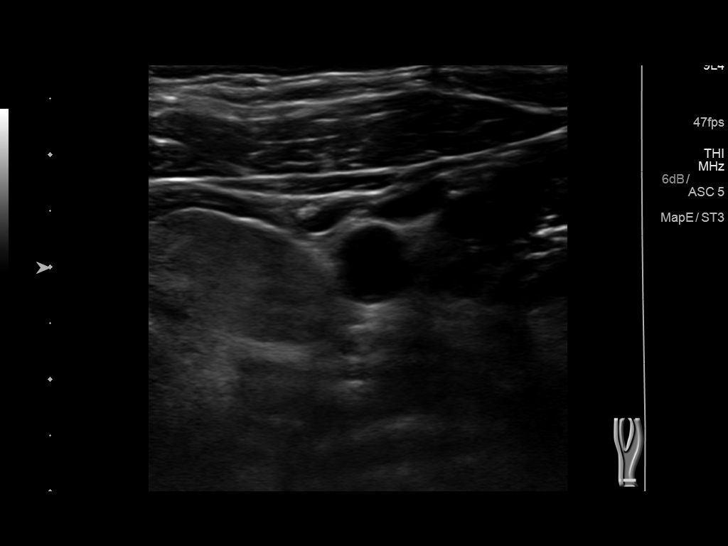
[im 38/68]
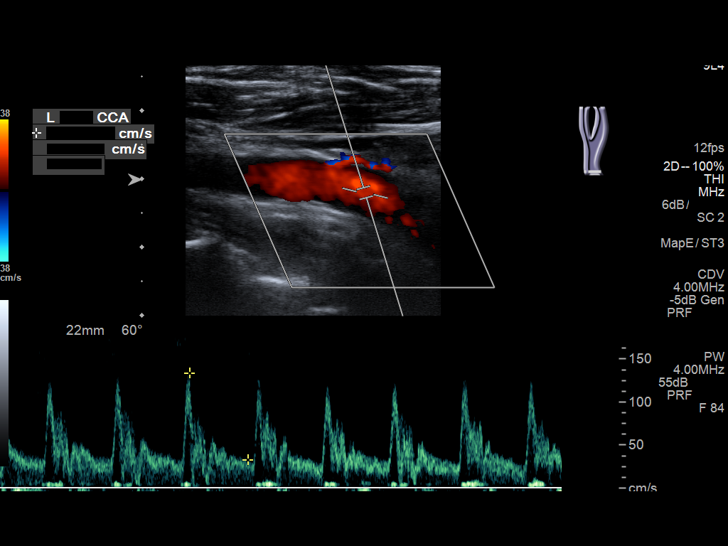
[im 44/68]
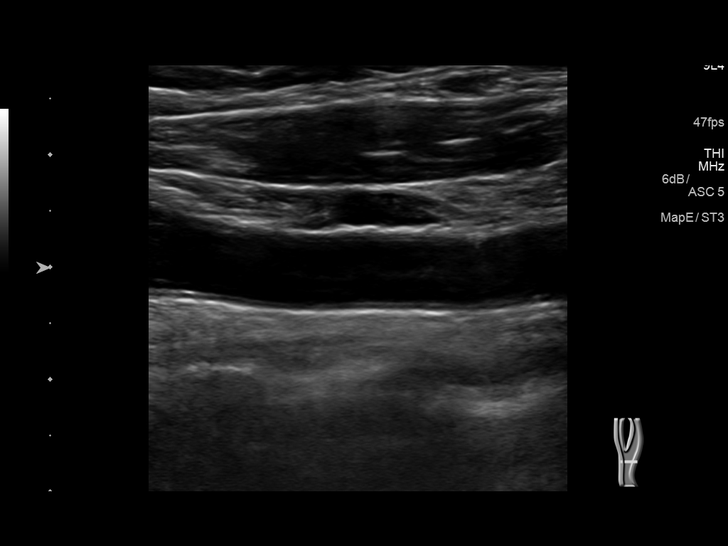
[im 50/68]
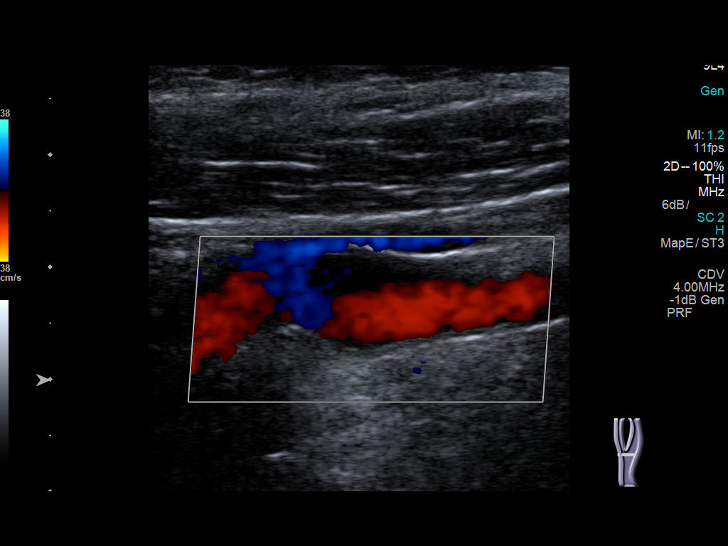
[im 56/68]
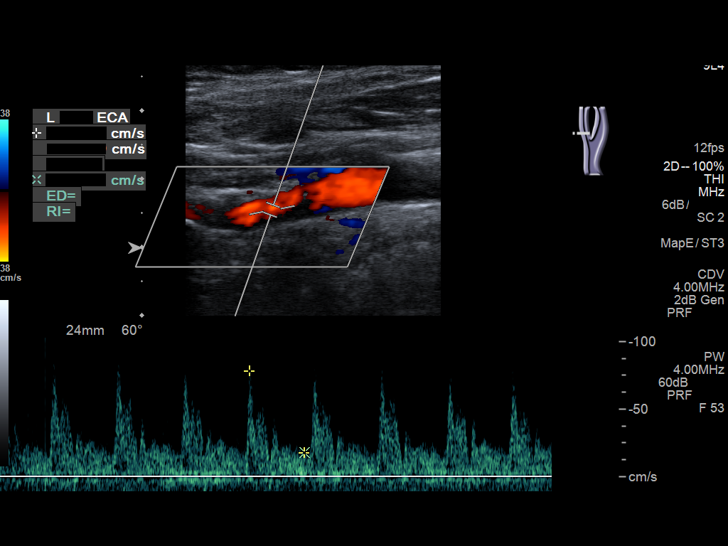
[im 62/68]
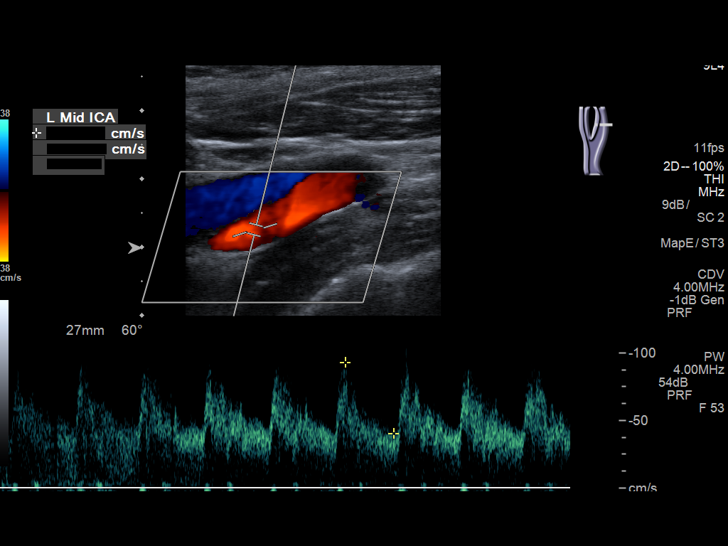
[im 68/68]
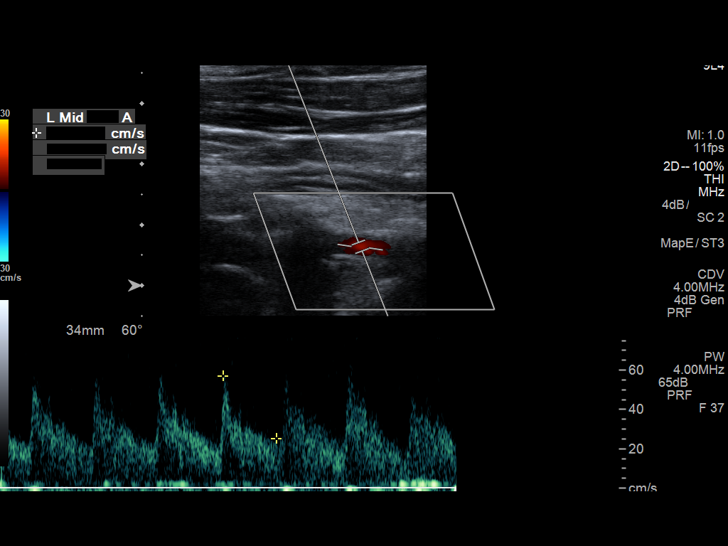

[13 of 24 positions shown; findings below may reference images not displayed]

FINDINGS: Criteria: Quantification of carotid stenosis is based on velocity
parameters that correlate the residual internal carotid diameter
with NASCET-based stenosis levels, using the diameter of the distal
internal carotid lumen as the denominator for stenosis measurement.

The following velocity measurements were obtained:

RIGHT

ICA:  105 cm/sec

CCA:  121 cm/sec

SYSTOLIC ICA/CCA RATIO:

DIASTOLIC ICA/CCA RATIO:

ECA:  111 cm/sec

LEFT

ICA:  97 cm/sec

CCA:  101 cm/sec

SYSTOLIC ICA/CCA RATIO:

DIASTOLIC ICA/CCA RATIO:

ECA:  78 cm/sec

RIGHT CAROTID ARTERY: Right carotid arteries are patent without
significant plaque or stenosis. Normal waveforms and velocities in
the internal carotid artery. External carotid artery is patent with
normal waveform.

RIGHT VERTEBRAL ARTERY: Antegrade flow and normal waveform in the
right vertebral artery.

LEFT CAROTID ARTERY: Left carotid arteries are patent without
significant plaque or stenosis. Normal waveforms and velocities in
the internal carotid artery. External carotid artery is patent with
normal waveform.

LEFT VERTEBRAL ARTERY: Antegrade flow and normal waveform in the
left vertebral artery.
IMPRESSION: Normal carotid artery duplex. No evidence for carotid artery plaque
or stenosis.
